# Patient Record
Sex: Male | Born: 1935 | Race: White | Hispanic: No | Smoking: Former smoker
Health system: Southern US, Community
[De-identification: ages and names within clinical notes are randomized; demographics above are authoritative.]

## PROBLEM LIST (undated history)

## (undated) DIAGNOSIS — I10 Essential (primary) hypertension: Secondary | ICD-10-CM

## (undated) DIAGNOSIS — Z9049 Acquired absence of other specified parts of digestive tract: Secondary | ICD-10-CM

## (undated) DIAGNOSIS — I2510 Atherosclerotic heart disease of native coronary artery without angina pectoris: Secondary | ICD-10-CM

## (undated) DIAGNOSIS — Z720 Tobacco use: Secondary | ICD-10-CM

## (undated) DIAGNOSIS — N289 Disorder of kidney and ureter, unspecified: Secondary | ICD-10-CM

## (undated) DIAGNOSIS — J449 Chronic obstructive pulmonary disease, unspecified: Secondary | ICD-10-CM

## (undated) HISTORY — DX: Disorder of kidney and ureter, unspecified: N28.9

## (undated) HISTORY — PX: TRANSURETHRAL RESECTION OF PROSTATE: SHX73

## (undated) HISTORY — DX: Atherosclerotic heart disease of native coronary artery without angina pectoris: I25.10

## (undated) HISTORY — PX: CHOLECYSTECTOMY: SHX55

## (undated) HISTORY — DX: Acquired absence of other specified parts of digestive tract: Z90.49

## (undated) HISTORY — DX: Chronic obstructive pulmonary disease, unspecified: J44.9

## (undated) HISTORY — DX: Essential (primary) hypertension: I10

## (undated) HISTORY — DX: Tobacco use: Z72.0

---

## 2001-03-09 ENCOUNTER — Inpatient Hospital Stay (HOSPITAL_COMMUNITY): Admission: AD | Admit: 2001-03-09 | Discharge: 2001-03-16 | Payer: Self-pay | Admitting: *Deleted

## 2001-03-11 ENCOUNTER — Encounter: Payer: Self-pay | Admitting: *Deleted

## 2001-03-12 ENCOUNTER — Encounter: Payer: Self-pay | Admitting: *Deleted

## 2005-10-05 HISTORY — PX: CORONARY ARTERY BYPASS GRAFT: SHX141

## 2005-10-19 ENCOUNTER — Inpatient Hospital Stay (HOSPITAL_COMMUNITY): Admission: EM | Admit: 2005-10-19 | Discharge: 2005-11-13 | Payer: Self-pay | Admitting: Emergency Medicine

## 2005-10-20 ENCOUNTER — Ambulatory Visit: Payer: Self-pay | Admitting: Cardiology

## 2005-10-20 ENCOUNTER — Ambulatory Visit: Payer: Self-pay | Admitting: *Deleted

## 2005-10-23 ENCOUNTER — Ambulatory Visit: Payer: Self-pay | Admitting: Pulmonary Disease

## 2005-11-01 ENCOUNTER — Encounter: Payer: Self-pay | Admitting: Thoracic Surgery (Cardiothoracic Vascular Surgery)

## 2005-11-23 ENCOUNTER — Ambulatory Visit: Payer: Self-pay | Admitting: Cardiology

## 2005-12-01 ENCOUNTER — Ambulatory Visit: Payer: Self-pay | Admitting: Cardiology

## 2005-12-31 ENCOUNTER — Ambulatory Visit: Payer: Self-pay | Admitting: Cardiology

## 2006-06-20 IMAGING — CR DG CHEST 1V PORT
1 series · 1 of 1 positions shown · non-contrast
Comparison: none

CLINICAL DATA: Myocardial infarction.   Question CHF. 
 PORTABLE CHEST ? 10/28/05:

[view not recorded]
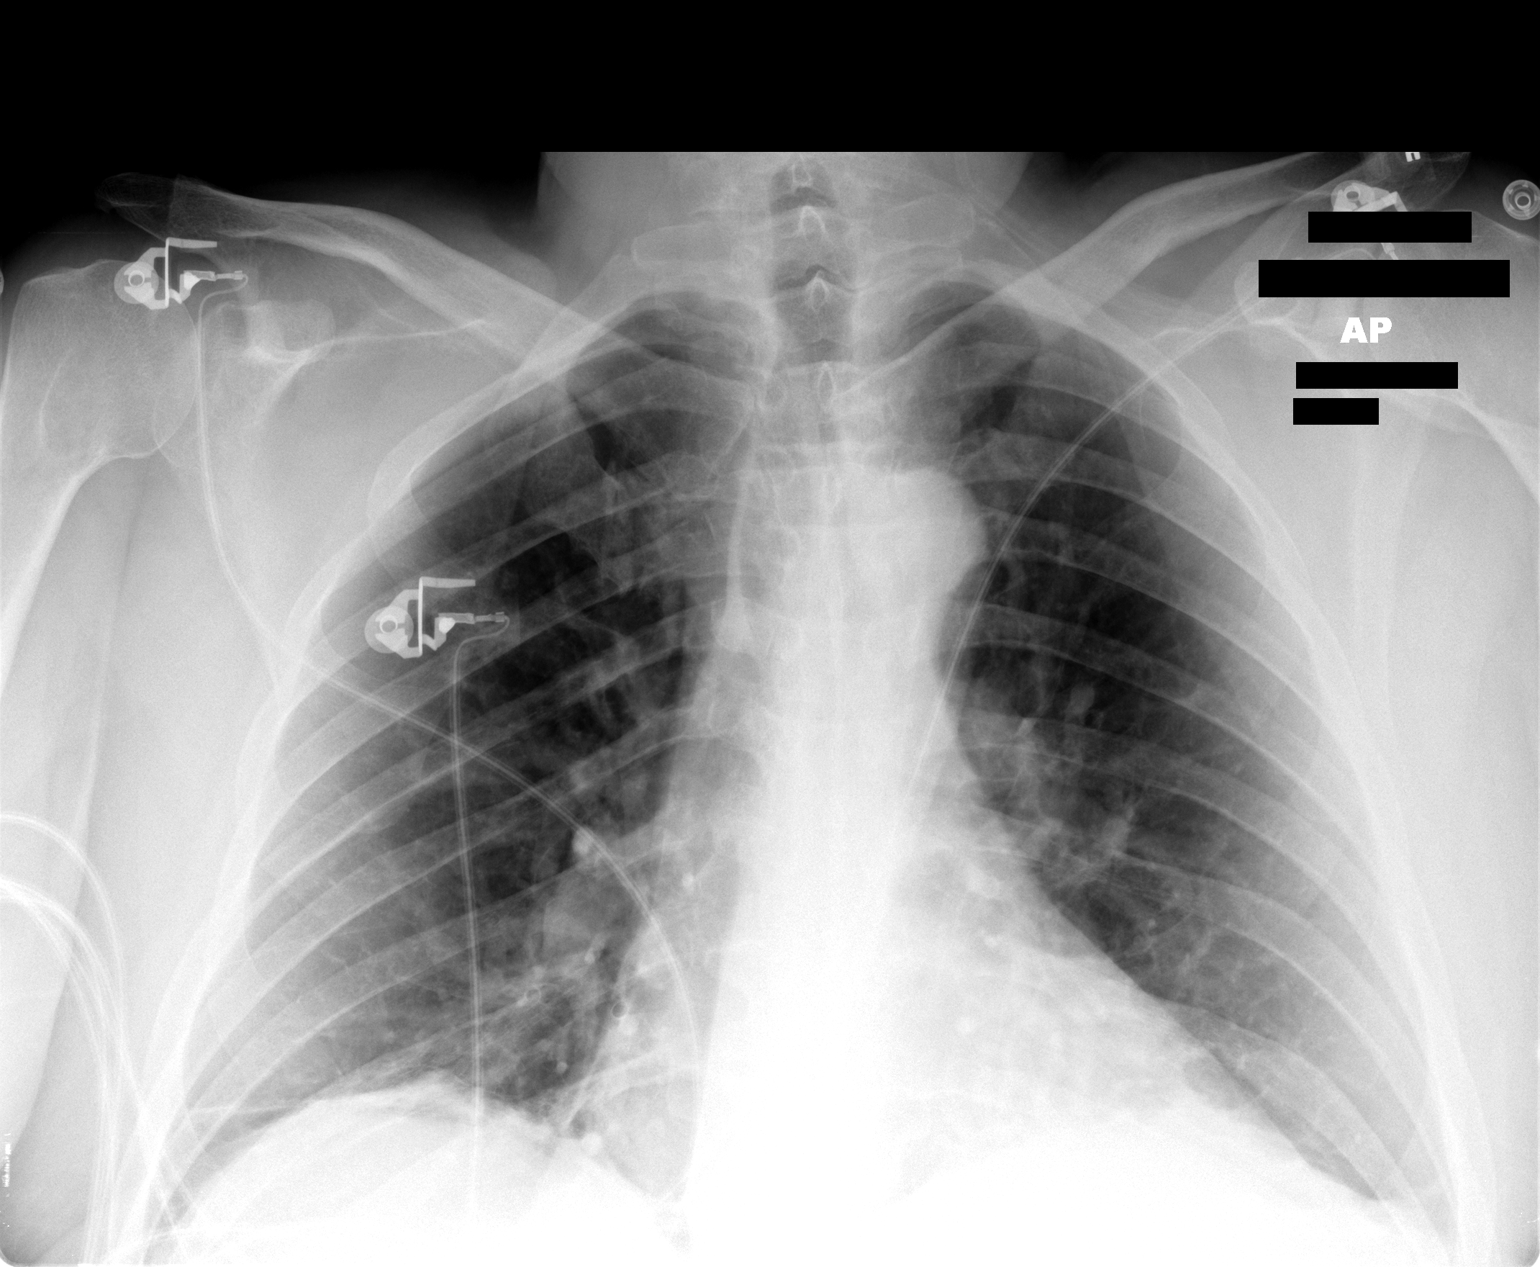

[1 of 1 positions shown; findings below may reference images not displayed]

FINDINGS: There is cardiomegaly.  Streaky bibasilar opacities most compatible with atelectasis persist.  No edema.  No effusion.
IMPRESSION: 1.  Negative for pulmonary edema. 
 2.  Persistent bibasilar streaky opacities most consistent with atelectasis.

## 2006-06-25 IMAGING — CR DG CHEST 1V PORT
1 series · 1 of 1 positions shown · non-contrast
Comparison: 11/01/05.

CLINICAL DATA: M.I.
 PORTABLE CHEST ([DATE] HOURS):

[view not recorded]
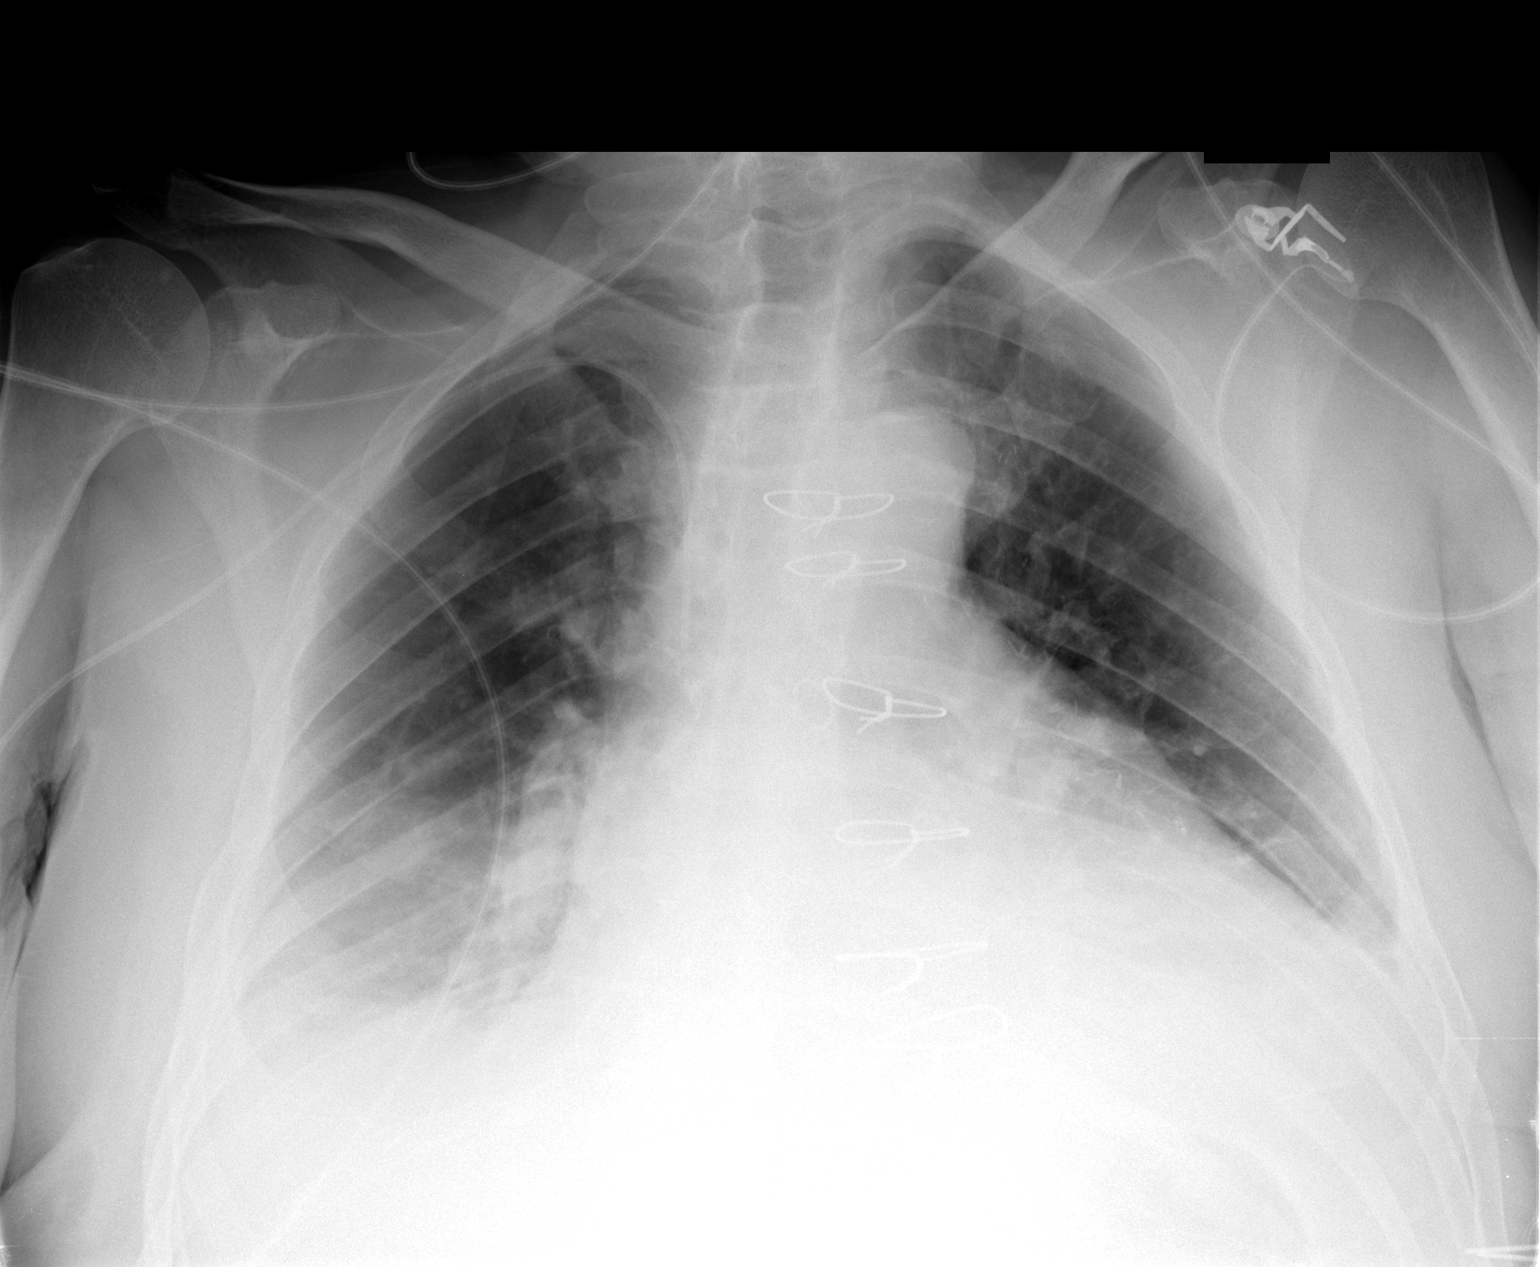

[1 of 1 positions shown; findings below may reference images not displayed]

FINDINGS: The right upper extremity PICC has been retracted.  The tip is now in the SVC.  The heart is enlarged.  Vascular congestion and interstitial edema are stable.  Bibasilar opacity and bilateral effusions are stable.  No pneumothoraces are seen.
IMPRESSION: Tip of the right upper extremity PICC is now in the SVC.  Otherwise stable exam.

## 2006-06-27 IMAGING — CR DG CHEST 1V PORT
1 series · 1 of 1 positions shown · non-contrast
Comparison: 11/03/05
 Cardiomegaly.

CLINICAL DATA: Chest pain.  CABG.
 PORTABLE CHEST- 1 VIEW (7177 hours):

[view not recorded]
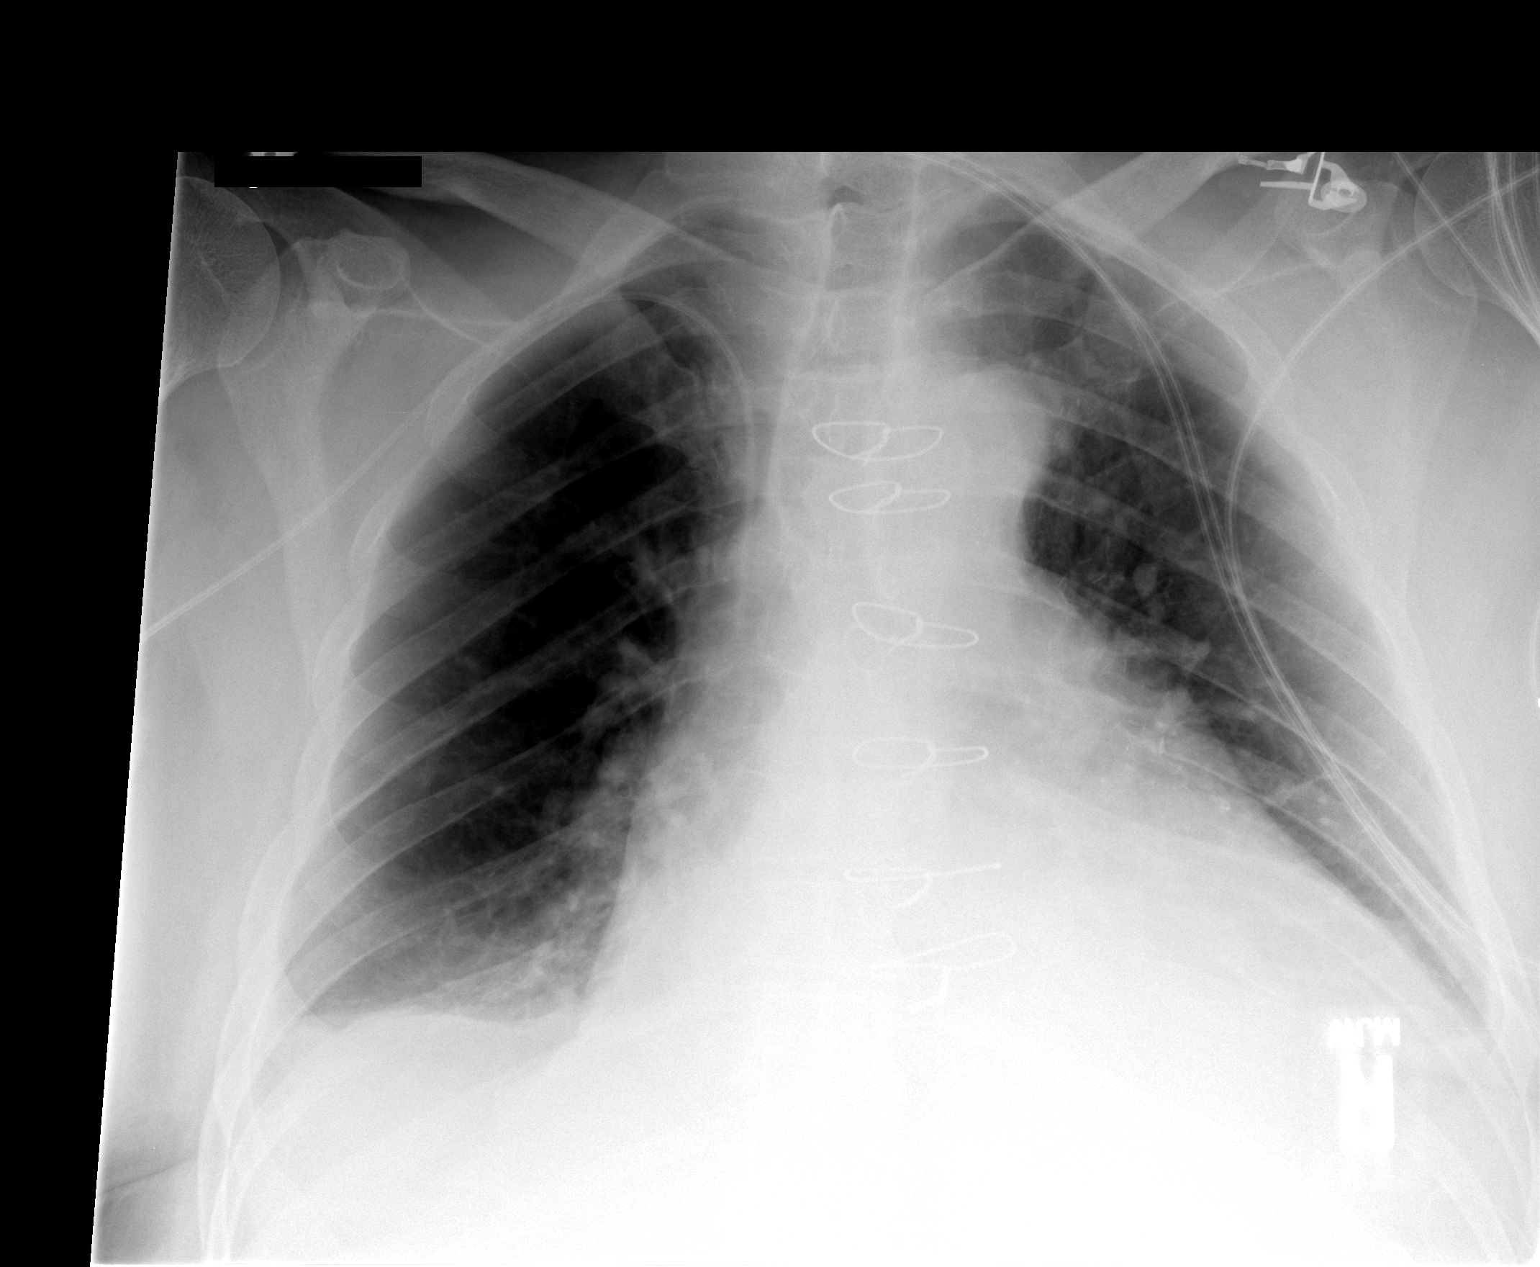

[1 of 1 positions shown; findings below may reference images not displayed]

Improving mild vascular congestion.  Improving left lower lobe atelectasis.  Small bilateral effusions persist.  PICC line tip SVC.  No pneumothorax.
IMPRESSION: Improved atelectasis and vascular congestion.

## 2006-07-03 IMAGING — CR DG CHEST 2V
2 series · 2 of 2 positions shown · non-contrast
Comparison: 11/08/05.

CLINICAL DATA: Post CABG.  Shortness of breath.  MI.
 CHEST - 2 VIEW:

[w chest pa]
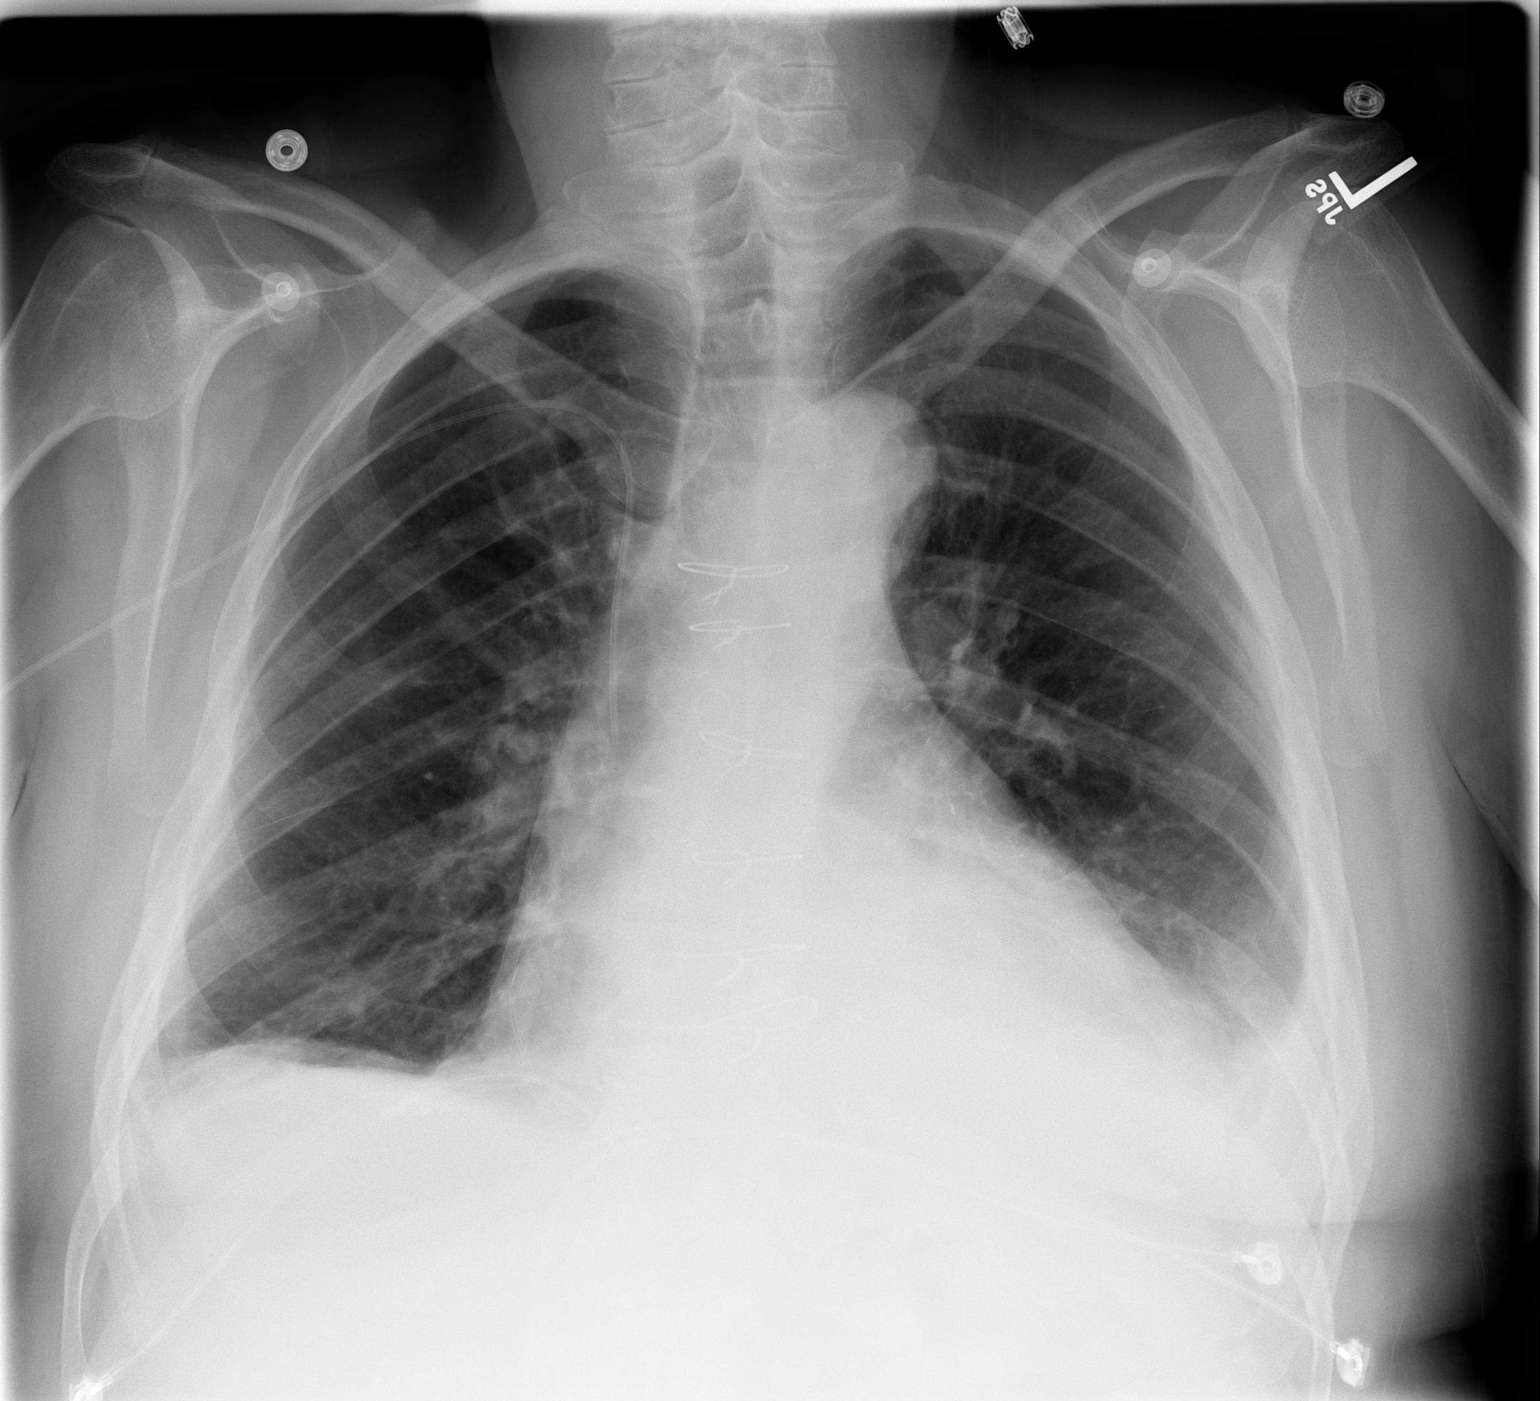

[w chest lat]
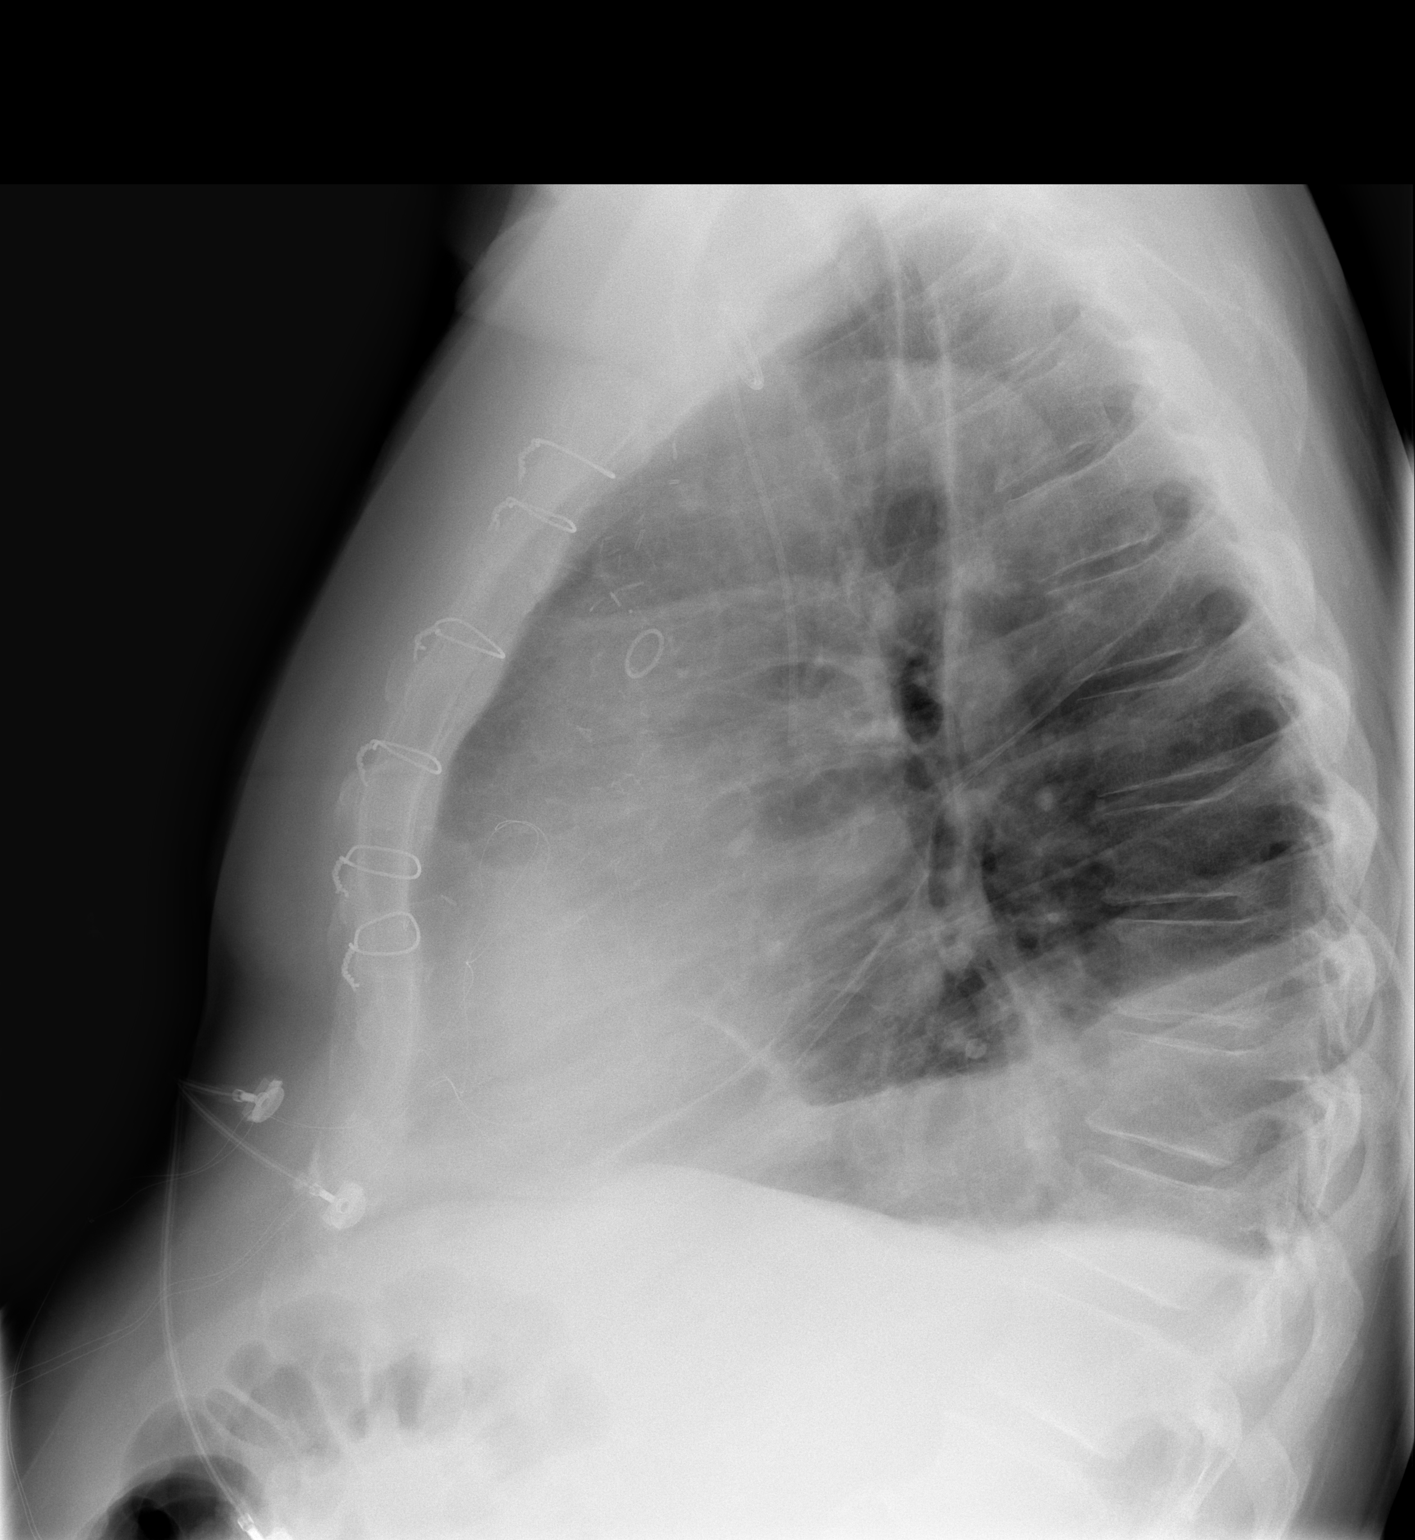

[2 of 2 positions shown; findings below may reference images not displayed]

FINDINGS: Enlarged cardiopericardial silhouette.  Small left pleural effusion.  Mild atelectasis at the left base.  Mild pulmonary vascular congestion.
IMPRESSION: Enlarged cardiopericardial silhouette.  Mild vascular congestion.  Small left pleural effusion and mild atelectasis at the bases.

## 2006-07-30 ENCOUNTER — Ambulatory Visit: Payer: Self-pay | Admitting: Cardiology

## 2006-08-13 ENCOUNTER — Ambulatory Visit: Payer: Self-pay | Admitting: Cardiology

## 2006-10-27 ENCOUNTER — Encounter: Payer: Self-pay | Admitting: Cardiology

## 2007-04-20 ENCOUNTER — Ambulatory Visit: Payer: Self-pay | Admitting: Physician Assistant

## 2008-05-02 ENCOUNTER — Ambulatory Visit: Admission: RE | Admit: 2008-05-02 | Discharge: 2008-07-31 | Payer: Self-pay | Admitting: Radiation Oncology

## 2008-05-21 ENCOUNTER — Ambulatory Visit: Payer: Self-pay | Admitting: Cardiology

## 2008-05-21 ENCOUNTER — Encounter: Payer: Self-pay | Admitting: Physician Assistant

## 2008-08-06 ENCOUNTER — Encounter: Payer: Self-pay | Admitting: Physician Assistant

## 2009-06-14 DIAGNOSIS — I2510 Atherosclerotic heart disease of native coronary artery without angina pectoris: Secondary | ICD-10-CM | POA: Insufficient documentation

## 2009-06-14 DIAGNOSIS — J45909 Unspecified asthma, uncomplicated: Secondary | ICD-10-CM | POA: Insufficient documentation

## 2009-06-14 DIAGNOSIS — N259 Disorder resulting from impaired renal tubular function, unspecified: Secondary | ICD-10-CM | POA: Insufficient documentation

## 2009-06-14 DIAGNOSIS — E119 Type 2 diabetes mellitus without complications: Secondary | ICD-10-CM | POA: Insufficient documentation

## 2009-06-17 ENCOUNTER — Ambulatory Visit: Payer: Self-pay | Admitting: Cardiology

## 2009-06-17 DIAGNOSIS — J4489 Other specified chronic obstructive pulmonary disease: Secondary | ICD-10-CM | POA: Insufficient documentation

## 2009-06-17 DIAGNOSIS — I4510 Unspecified right bundle-branch block: Secondary | ICD-10-CM | POA: Insufficient documentation

## 2009-06-17 DIAGNOSIS — J449 Chronic obstructive pulmonary disease, unspecified: Secondary | ICD-10-CM | POA: Insufficient documentation

## 2009-06-17 DIAGNOSIS — Z951 Presence of aortocoronary bypass graft: Secondary | ICD-10-CM | POA: Insufficient documentation

## 2009-08-19 ENCOUNTER — Encounter: Payer: Self-pay | Admitting: Cardiology

## 2009-08-20 ENCOUNTER — Encounter: Payer: Self-pay | Admitting: Cardiology

## 2010-01-31 ENCOUNTER — Ambulatory Visit: Payer: Self-pay | Admitting: Cardiology

## 2010-11-03 ENCOUNTER — Telehealth (INDEPENDENT_AMBULATORY_CARE_PROVIDER_SITE_OTHER): Payer: Self-pay | Admitting: *Deleted

## 2010-11-04 NOTE — Assessment & Plan Note (Signed)
Summary: 6 MO FU MARCH REMINDER-SRS   Visit Type:  Follow-up Primary Provider:  Dr. Erasmo Downer   History of Present Illness: the patient is a 77 year old male with history of coronary artery disease, status post coronary artery bypass grafting in 2007 normal LV systolic function.  The patient denies any chest pain or shortness of breath palpitations or syncope.  He remains very active and still walks every day.  Is stable from a cardiovascular perspective.  He reports no other symptoms.  Preventive Screening-Counseling & Management  Alcohol-Tobacco     Smoking Status: quit > 6 months  Comments: quit smoking about 25 yrs ago. smoked for about 30 yrs  Current Medications (verified): 1)  Potassium Chloride Crys Cr 20 Meq Cr-Tabs (Potassium Chloride Crys Cr) .... Take 1 Tablet By Mouth Once A Day 2)  Pravastatin Sodium 40 Mg Tabs (Pravastatin Sodium) .... Take 1 Tablet By Mouth At Bedtime 3)  Metoprolol Tartrate 50 Mg Tabs (Metoprolol Tartrate) .... Take 1/2 Tablet By Mouth Twice A Day 4)  Furosemide 40 Mg Tabs (Furosemide) .... Two Times A Day 5)  Aspirin 81 Mg  Tabs (Aspirin) .... Daily 6)  Prilosec 20 Mg Cpdr (Omeprazole) .... Daily 7)  Mucinex 600 Mg Xr12h-Tab (Guaifenesin) .... As Needed 8)  Multivitamins   Tabs (Multiple Vitamin) .... Daily 9)  Hormone Injection .... Every 3 Months 10)  Proventil Hfa 108 (90 Base) Mcg/act Aers (Albuterol Sulfate) .... 2 Puffs As Needed 11)  Alendronate Sodium 70 Mg Tabs (Alendronate Sodium) .... Take 1 Tablet By Mouth Once Per Week.  Allergies: No Known Drug Allergies  Comments:  Nurse/Medical Assistant: The patient's medications were reviewed with the patient and were updated in the Medication List. Pt brought a list of medications to office visit.  Cyril Loosen, RN, BSN (January 31, 2010 9:22 AM)  Past History:  Past Medical History: Last updated: 06/17/2009  1.  Coronary artery disease as outlined above. non-ST elevation  myocardial infarction followed by coronary bypass grafting in 2007 with normal LV systolic function.  2.  Hypertension.  3.  Diet managed type 2 diabetes mellitus.  4.  Chronic renal insufficiency.  5.  Reported history of asthma with remote tobacco use.  6.  History of hematuria with TURP back in 2002.  7.  Status post cholecystectomy. COPD  Past Surgical History: Last updated: 06/14/2009  Status post cholecystectomy.  Social History: Last updated: 06/14/2009 The patient lives in [place] with his wife. He has a 30  pack year history of smoking but quit 20 years ago. He is retired from [employer]. He denies any significant alcohol or illicit substance use.  Risk Factors: Smoking Status: quit > 6 months (01/31/2010)  Review of Systems  The patient denies fatigue, malaise, fever, weight gain/loss, vision loss, decreased hearing, hoarseness, chest pain, palpitations, shortness of breath, prolonged cough, wheezing, sleep apnea, coughing up blood, abdominal pain, blood in stool, nausea, vomiting, diarrhea, heartburn, incontinence, blood in urine, muscle weakness, joint pain, leg swelling, rash, skin lesions, headache, fainting, dizziness, depression, anxiety, enlarged lymph nodes, easy bruising or bleeding, and environmental allergies.    Vital Signs:  Patient profile:   77 year old male Height:      70 inches Weight:      188.25 pounds Pulse rate:   59 / minute BP sitting:   137 / 77  (left arm) Cuff size:   regular  Vitals Entered By: Cyril Loosen, RN, BSN (January 31, 2010 9:20 AM) Comments Follow  up visit   Physical Exam  Additional Exam:  General: Well-developed, well-nourished in no distress head: Normocephalic and atraumatic eyes PERRLA/EOMI intact, conjunctiva and lids normal nose: No deformity or lesions mouth normal dentition, normal posterior pharynx neck: Supple, no JVD.  No masses, thyromegaly or abnormal cervical nodes lungs: Normal breath sounds  bilaterally without wheezing.  Normal percussion heart: regular rate and rhythm with normal S1 and S2, no S3 or S4.  PMI is normal.  No pathological murmurs abdomen: Normal bowel sounds, abdomen is soft and nontender without masses, organomegaly or hernias noted.  No hepatosplenomegaly musculoskeletal: Back normal, normal gait muscle strength and tone normal pulsus: Pulse is normal in all 4 extremities Extremities: No peripheral pitting edema neurologic: Alert and oriented x 3 skin: Intact without lesions or rashes cervical nodes: No significant adenopathy psychologic: Normal affect    Impression & Recommendations:  Problem # 1:  CORONARY ARTERY BYPASS GRAFT, HX OF (ICD-V45.81) no recurrent chest pain.  No indication for stress testing.  Continue current medical regimen.  Problem # 2:  RBBB (ICD-426.4) stable His updated medication list for this problem includes:    Metoprolol Tartrate 50 Mg Tabs (Metoprolol tartrate) .Marland Kitchen... Take 1/2 tablet by mouth twice a day    Aspirin 81 Mg Tabs (Aspirin) .Marland Kitchen... Daily  Problem # 3:  RENAL INSUFFICIENCY (ICD-588.9) although the patient's primary care physician.  Patient Instructions: 1)  Your physician recommends that you continue on your current medications as directed. Please refer to the Current Medication list given to you today. 2)  Follow up in  1 year

## 2010-11-12 NOTE — Progress Notes (Signed)
Summary: NEED CHOLESTEROL LABS/  Phone Note Outgoing Call Call back at Home Phone [phone]   Call placed by: Carlye Grippe,  November 03, 2010 9:17 AM Call placed to: Patient Summary of Call: Called and informed patient that we don't have any cholesterol labs since 2009 and he said Anwar checked lipids about 6 months ago. Nurse advised patient to have labs sent to our office so that we can manage refills according to results or to have request for lipid meds sent to PCP. Nurse also notified [facility] office to send [place] results or manage refills for pravastatin for this patient. Initial call taken by: Carlye Grippe,  November 03, 2010 9:18 AM

## 2011-02-09 ENCOUNTER — Other Ambulatory Visit: Payer: Self-pay | Admitting: Cardiology

## 2011-02-11 ENCOUNTER — Other Ambulatory Visit: Payer: Self-pay | Admitting: *Deleted

## 2011-02-11 MED ORDER — POTASSIUM CHLORIDE CRYS ER 20 MEQ PO TBCR: 20.0000 meq | EXTENDED_RELEASE_TABLET | Freq: Every day | ORAL | Status: DC

## 2011-02-17 NOTE — Assessment & Plan Note (Signed)
[facility]                          [facility] NOTE   NAME:Akopyan, Winford J                       MRN:          [id number]  DATE:05/21/2008                            DOB:          [birth year]    CARDIOLOGIST:  Learta Codding, MD, [facility]   PRIMARY CARE PHYSICIAN:  Erasmo Downer, MD   REASON FOR VISIT:  Twelve-month followup.   HISTORY OF PRESENT ILLNESS:  [name] is a 77 year old male patient  with a history of CAD status post CABG in January 2007, who returns to  the office today for followup.  Since last being seen, he has been  diagnosed with prostate cancer.  He is apparently on Lupron therapy and  the plan is to proceed with radiation therapy in the coming month.  Aside from his prostate cancer diagnosis, he has been doing well.  He  denies any chest pain.  He walks up to 2 hours a day without any  symptoms of chest discomfort or shortness of breath.  He denies  orthopnea, PND, or pedal edema.  He denies any syncope or near-syncope.  He does note some increased congestion recently as well as wheezing.  He  has been taking Mucinex over-the-counter for this.   CURRENT MEDICATIONS:  Lopressor 25 mg b.i.d., Lasix 40 mg b.i.d.,  Potassium 20 mEq daily, Aspirin 81 mg daily, Prilosec daily,Multivitamin  daily, Hormone shots as directed,Mucinex p.r.n.   PHYSICAL EXAMINATION:  GENERAL:  He is a well-nourished, well-developed  male, in acute distress.  VITAL SIGNS:  Blood pressure is 129/78, pulse 67, and weight 174.8  pounds.  HEENT:  Normal.  NECK:  Without JVD.  CARDIAC:  Normal S1 and S2, regular rate and rhythm.  No murmur.  LUNGS:  Clear to auscultation bilaterally.  ABDOMEN:  Soft, nontender, normoactive bowel sounds.  No organomegaly.  EXTREMITIES:  Without edema.  NEUROLOGIC:  He is alert and oriented x3.  Cranial nerves II-XII grossly  intact.  VASCULAR:  No carotid artery bruits noted bilaterally.   Electrocardiogram  reveals sinus rhythm with a heart rate of 67,  rightward axis, right bundle-branch block.  No significant change to the  previous tracing.   ASSESSMENT/PLAN:  1. Coronary artery disease status post non-ST-elevation myocardial      infarction followed by subsequent coronary artery bypass graft,      January 2007.  The patient has been noted to have preserved LV      function in the past.  He is overall stable and does not have any      symptoms of angina.  He is on a good medical regimen aside from      statin therapy.  No changes will be made today in his current      medications.  2. Dyslipidemia.  The patient apparently came off the statin last year      when he was told that his cholesterol numbers looked good.  We will      restart him with pravastatin 40 mg nightly and recheck lipids and      LFTs in  12 weeks.  3. Hypertension, controlled.  He will continue on his current      medications.  4. Chronic renal insufficiency.  He will continue to follow up with      Dr. Linna Darner.  5. Chronic obstructive pulmonary disease.  He has had increased      congestion recently.  He will continue on Mucinex and follow up      with Dr. Linna Darner for this as well.   DISPOSITION:  The patient will be brought back in followup in the next  12 months or sooner p.r.n.      Tereso Newcomer, PA-C  Electronically Signed      Madolyn Frieze. Jens Som, MD, [facility]  Electronically Signed   SW/MedQ  DD: 05/21/2008  DT: 05/22/2008  Job #: [id number]   cc:   Erasmo Downer, MD

## 2011-02-17 NOTE — Assessment & Plan Note (Signed)
[facility]                          [facility] NOTE   NAME:Babich, John J                       MRN:          [id number]  DATE:04/20/2007                            DOB:          Apr 24, 1930    CARDIOLOGIST:  Dr. Lewayne Bunting.   PRIMARY CARE PHYSICIAN:  Dr. Linna Darner.   REASON FOR VISIT:  Six month followup.   HISTORY OF PRESENT ILLNESS:  Mr. Renovato is a very pleasant 77 year old  male patient with a history of coronary artery disease status post non-  ST elevation myocardial infarction and subsequent 2-vessel CABG who  reports to the office today for routine followup.  He tells me that he  feels better than he has felt in many years.  Overall since his bypass  surgery he has lost about 50 pounds.  He is walking every day.  He says  he is walking up to 10 miles.  He denies any chest discomfort or  shortness of breath.  Denies any syncope or near syncope.  Denies any  orthopnea or paroxysmal nocturnal dyspnea or pedal edema.  Denies any  palpitations.   CURRENT MEDICATIONS:  1. Lopressor 25 mg b.i.d.  2. Vytorin 10/20 mg nightly.  3. Lasix 40 mg b.i.d.  4. Klor-Con 20 mEq daily.  5. Aspirin 81 mg daily.  6. Prilosec over the counter.  7. Multivitamin daily.  8. Albuterol p.r.n.   ALLERGIES:  NO KNOWN DRUG ALLERGIES.   PHYSICAL EXAMINATION:  He is a well-nourished, well-developed male in no  distress.  Blood pressure is 118/78. Pulse 50, weight 178.8 pounds.  HEENT:  Normal.  NECK:  Without JVD, carotids without bruits bilaterally.  CARDIAC:  Normal S1-S2, regular rate and rhythm.  LUNGS:  Clear to auscultation bilaterally without wheezing, rhonchi, or  rales.  ABDOMEN:  Soft, nontender.  Easily reducible periumbilical hernia noted.  EXTREMITIES:  Without edema.  Calves soft, nontender.  NEUROLOGIC:  He is alert and oriented x3, cranial nerves II-XII grossly  intact, dorsalis pedis and posterior tibialis pulses are 1-2+  bilaterally.   Electrocardiogram reveals sinus rhythm with a heart rate of 52,  rightward axis, right bundle branch block, no significant change since  previous tracing.   IMPRESSION:  1. Coronary artery disease.      a.     Status post non-ST elevation myocardial infarction January       2007 followed by 2-vessel coronary artery bypass grafting (left       internal mammary artery to the diagonal, vein graft to the obtuse       marginal).  2. Preserved left ventricular function.  3. Chronic obstructive pulmonary disease.  4. Hypertension.  5. Hyperlipidemia.  6. Chronic renal insufficiency.   PLAN:  Patient is doing well from a cardiovascular standpoint.  He is  having no symptoms of angina.  He is quite active and walking on a daily  basis.  He has lost a considerable amount of weight since 2007 and he is  also no longer smoking.  We will set him up for followup CMET and lipids  to  both monitor his lipid management and his renal insufficiency.  Otherwise he will continue the same medications and followup with [place] in  about 6 months or sooner as needed.      Tereso Newcomer, PA-C  Electronically Signed      Learta Codding, MD,FACC  Electronically Signed   SW/MedQ  DD: 04/20/2007  DT: 04/20/2007  Job #: [id number]   cc:   Erasmo Downer, MD

## 2011-02-20 NOTE — Discharge Summary (Signed)
NAMELehi [name], Jimmy [name]                ACCOUNT NO.:  [id number]   MEDICAL RECORD NO.:  [id number]          PATIENT TYPE:  INP   LOCATION:  2036                         FACILITY:  [facility]   PHYSICIAN:  Jimmy [name], M.D.DATE OF BIRTH:  1930-05-26   DATE OF ADMISSION:  10/20/2005  DATE OF DISCHARGE:  11/12/2005                                 DISCHARGE SUMMARY   PRIMARY DIAGNOSES:  Severe three-vessel coronary artery disease, status post  non-Q wave myocardial infarction.   IN HOSPITAL DIAGNOSES:  1.  Acute blood loss anemia.  2.  Acute renal insufficiency.  3.  Nonsustained ventricular tachycardia.  4.  Postoperative atrial fibrillation.  5.  Superficial sternal wound infection.   SECONDARY DIAGNOSES:  1.  Hypertension.  2.  Diabetes mellitus type 2.  3.  Chronic renal insufficiency.  4.  History of asthma with remote tobacco use.  5.  History of hematuria with transurethral resection of prostate back in      2002.  6.  Status post cholecystectomy.  7.  Chronic obstructive pulmonary disease.  8.  History of coronary artery disease.   ALLERGIES:  NO KNOWN DRUG ALLERGIES.   IN HOSPITAL OPERATIONS AND PROCEDURES:  1.  Cardiac catheterization with left heart catheterization and selective      coronary artery angiography.  2.  Coronary artery bypass grafting x2 using a left internal mammary artery      to the diagonal and a saphenous vein graft to obtuse marginal.      Endoscopic vein harvesting from the left leg was done.   HISTORY AND PHYSICAL, AND HOSPITAL COURSE:  [name] is a 77 year old  gentleman with multiple medical problems.  He had presented with acute  respiratory problems, the patient has a history of COPD and asthma.  On  presentation with acute respiratory problems, the patient was seen to have  ruled in for a non-ST elevated myocardial infarction.  The patient was  transferred [facility].  The patient was taken  to the cath  lab by Dr. Dorethea Clan on October 21, 2005 where he underwent cardiac  catheterization.  This showed severe three-vessel coronary artery disease.  The patient was seen and evaluated by Dr. Dorris [name].  Dr. Dorris [name]  discussed with the patient undergoing coronary artery bypass grafting.  He  discussed the risks and benefits of this procedure.  The patient  acknowledged his understanding and agreed to proceed.  The patient was  tentatively scheduled for surgery for Monday, October 26, 2005.  Surgery had  to be postponed due to requiring a pulmonary workup.  Pulmonary was  consulted and evaluated the patient.  They recommended placing the patient  on a proton pump inhibitor twice a day.  They also recommended obtaining  PFTs prior to surgery.  They did not feel that there was a need for  antibiotics at that time.  They started the patient on a bronchodilator  regimen.  PFTs were done on Monday, October 26, 2005, which showed mild  obstruction by the FEV1.  Also, showed moderate air trapping.  Also  prior to  surgery, the patient subsequently had a retroperitoneal bleed while on  anticoagulation.  After a discontinuation of anticoagulation, the patient  continued to have unstable post infarct anginal symptoms.  Dr. Dorris [name]  discussed with patient the high risk of undergoing surgery due to pulmonary  issues, as well as patient having developed acute on chronic renal  insufficiency.  It was felt that the patient would not be able to postpone  the surgery any further due to anginal symptoms.  The surgery was scheduled  for October 30, 2005.  Prior to surgery, the patient had preoperative  bilateral carotid duplex ultrasound done showing no significant ICA  stenosis.   The patient was taken to the operating room on October 30, 2005 where he  underwent coronary artery bypass grafting x2 using a left internal mammary  artery to the diagonal and saphenous vein graft to the obtuse marginal.   Endoscopic vein harvesting was done from the left leg.  The patient  tolerated this procedure well and was transferred up to the intensive care  unit in stable condition.  The patient was extubated late evening/early  morning following surgery.  Following extubation, the patient seemed to be  alert and oriented x4, and neuro intact.  During the patient's postoperative  course, the patient's creatinine continued to elevate.  He was started on  renal dopamine postoperatively.  Over the next several days, the patient's  creatinine plateaued at 3.  It then began to start trending back down to his  baseline.  Last creatinine documented was 2.5 on postoperative day 14.  Also  during the patient's postoperative course, he developed postoperative atrial  fibrillation.  This was postop day 2.  The patient was started on amiodarone  and was able to convert back to normal sinus rhythm.  The patient remained  in normal sinus rhythm the remainder of his hospital stay.  The IV  amiodarone was discontinued and the patient was started on p.o. amiodarone.  The patient will be DC'd home on amiodarone.  The patient also developed  acute blood loss anemia postoperatively.  This did not require transfusion.  This was monitored and the patient remained stable with last hemoglobin and  hematocrit being 9 and 26.  The patient was asymptomatic from the anemia.  Also during the patient's postoperative course, he developed a sternal  drainage from his distal incision site.  The patient was started on  vancomycin.  Cultures were sent.  These cultures showed to be negative.  IV  vancomycin was continued during his hospital stay.  The patient was started  on ciprofloxacin on postoperative day 13.  Plan is to DC home on Cipro for  several days.  The remainder of the patient's postoperative course was  unremarkable.  Chest tubes and lines were DC'd in the normal fashion.  The patient was transferred out to 2000 on postop  day 10.  The patient was able  to weaned off oxygen satting greater than 90% on room air.  The patient's  CBGs were monitored during his postoperative course and were well  controlled.  He did require Lantus insulin for several days, but this was  DC'd prior to discharge home.  The patient remained afebrile during his  hospital course.  The patient's blood pressure was stable during his  hospital course and medications were adjusted appropriately.  Pulmonary did  reevaluate the patient postoperatively and recommended continuing the  Spiriva daily, and having the albuterol MDI as needed q.4h.  They also  recommended keeping patient on Protonix b.i.d. and to avoid ACE inhibitors.  They thought patient had some moderate emphysema.  From their standpoint,  the patient is ready for DC home.  The patient did develop volume overload  postoperatively and was started on diuretics for several days.  He was back  down towards his normal weight prior to DC home.  Do not plan on DC'ing  patient on Lasix due to the acute on chronic renal insufficiency and the  patient being back near his preoperative weight.   The patient is tentatively ready for DC home, November 13, 2005.  Clydie Braun  Meninger has arranged for a home health nurse to assist with the patient at  home.  Mr. Krahenbuhl received instructions on diet, activity level, and  incisional care.  He was told no driving until released to do so and no  heavy lifting over 10 pounds.  The patient was told he was allowed to shower  washing his incisions using soap and water.  He is to contact the office if  his incisions' drainage does not decrease or becomes purulent, or incision  starts to open up.  The patient is also to contact the office if he develops  a fever greater than 101.1 degrees Fahrenheit.  The patient was told to  ambulate 3 to 4 per day and progress as tolerated and to continue his  breathing exercise.  He was educated on diet to be low-fat,  low-salt.  A  follow up appointment has been arranged with Dr. Dorris [name] for December 04, 2005 at 11:45 a.m.  An appointment will be arranged with Dr. Diona Browner for in  2 weeks.  The patient will obtain AP and lateral chest x-ray at this  appointment, which he will then bring with him to Dr. Sunday Corn  appointment.   DISCHARGE MEDICATIONS:  1.  Aspirin 325 mg daily.  2.  Lopressor 25 mg b.i.d.  3.  Vytorin 10/20 q.h.s.  4.  Cipro 500 mg daily x7 days.  5.  Protonix 40 mg b.i.d.  6.  Spiriva inhaler 18 mcg, 1 puff in the a.m.  7.  Albuterol MDI 2 puffs q.4h. p.r.n.  8.  Amiodarone 200 mg b.i.d.  9.  Darvocet N-100 1 to 2 tabs q.4-6h. p.r.n. pain.      Theda Belfast, PA    ______________________________  Jimmy Decent Dorris [name], M.D.    KMD/MEDQ  D:  11/12/2005  T:  11/12/2005  Job:  [id number]   cc:   Jonelle Sidle, M.D. [facility]  [address]  [place]  [place] [address]

## 2011-02-20 NOTE — Discharge Summary (Signed)
[place]. [facility]  Patient:    Jimmy [name], Jimmy [name]                      MRN: [id number] Adm. Date:  [id number] Disc. Date: [id number] Attending:  Daisey Must Dictator:   Brita Romp, P.A.C. CC:         M. Linna Darner, M.D., [address], [place], [place] [address]  Dr. Dennie Maizes, [address] [place], [place] [address]  [facility]   Discharge Summary  DISCHARGE DIAGNOSES: 1. Non-Q-wave myocardial infarction. 2. Status post transurethral resection of the prostate. 3. Chronic renal insufficiency. 4. Anemia. 5. Non-insulin dependent diabetes.  HOSPITAL COURSE:  The patient was received in transfer from [facility] on March 09, 2001, for cardiac catheterization.  The patient had undergone a TURP procedure at [facility].  Postoperatively, he had complained of some chest pain and had non-Q-wave MI.  On March 10, 2001, the patient was seen by Lewayne Bunting, M.D.  He noted that there was decreased bleeding from the Foley catheter.  The patient denied any recurrent chest pain.  His BUN is noted to be 23 with a creatinine of 2.1. Hemoglobin is 9.7 and hematocrit 28.1.  Dr. Andee Lineman felt that it was best to discontinue the patients heparin and defer catheterization for the current time.  He discussed this with the family and planned for adenosine Cardiolite exam.  Later that night, the patients hemoglobin had dropped to 7.4 with hematocrit of 21.7.  He was given two units of packed red cells.  The following morning, the patient was seen by Daisey Must, M.D.  It was noted that his hemoglobin was 8.8 with hematocrit of 25.3.  His BUN had risen to 46 with creatinine of 3.1.  As a result of the patients continuing anemia, additional transfusion of one unit of packed cells was ordered.  The patient was then seen by Lindaann Slough, M.D., of urology.  He had reviewed the prior renal ultrasound and ordered a repeat one to assess postop changes. The  results noted some mild fullness in the right collecting system and some moderate left hydronephrosis.  Dr. Brunilda Payor noted that he had had bilateral hydronephrosis preop.  He ordered that the continuous bladder irrigation be continued until the urine was clear.  On March 12, 2001, the patient underwent adenosine Cardiolite exam.  It revealed ejection fraction of 45%.  There was an inferolateral scar as well as ischemia in the same area.  Luis Abed, M.D., felt that the patient would definitely need a catheterization; however, in view of the patients renal function it may be best for him to go home first.  On March 13, 2001, the patient was again seen by Dr. Myrtis Ser.  He noted that history hemoglobin was 8.5 and is slowly trending down. The patient denied any chest pain or shortness of breath.  On March 14, 2001, the patient was seen by Dr. Lewayne Bunting.  Noted the patients hemoglobin was 9.2 with hematocrit of 26.8.  He changed the patients iron sulfate to ferrous gluconate 325 mg p.o. t.i.d. and added vitamin C 500 mg once daily.  He also increased the patients Lopressor to 25 mg p.o. b.i.d. He noted that the patients renal function had improved slightly to a BUN of 29 and a creatinine of 2.0.  The next day, Dr. Andee Lineman again saw the patient.  He noted that the patient had a brief run of nonsustained v. tach.  He was asymptomatic with this.  The patients hemoglobin was 9.3.  He agreed with the plan to defer catheterization pending further improvement of the patients renal function. He instructed the patient that if chest pain should return, he should go to the hospital in [place], [place].  The following day, the patient was seen by Dr. Brunilda Payor.  Foley had been removed and the patient was voiding well on his own.  He felt from a urological standpoint, the patient could be discharged home on Septra DS.  The patient was then seen by Dietrich Pates, M.D.  She felt that the patient was stable  for discharge.  She reviewed with the patient that he would need a catheterization and instructed him on what symptoms to watch for that would indicate return for catheterization.  DISCHARGE MEDICATIONS: 1. Septra DS one tablet b.i.d. until gone. 2. Enteric coated aspirin 81 mg q.d. 3. Ferrous gluconate t.i.d. 4. Vitamin C 500 mg q.d. 5. Lopressor 25 mg p.o. b.i.d. 6. Albuterol as previously taken. 7. Nitroglycerin as needed. 8. Amaryl as previously taken.  DISCHARGE INSTRUCTIONS: 1. The patient was advised to fully return to his normal level of activity. 2. He was advised to eat a low fat, low cholesterol diet. 3. He was advised to discontinue taking Accupril. 4. He is to follow up with Dr. Dennie Maizes as needed or scheduled. 5. He is to follow up with M. Linna Darner, M.D., as needed or scheduled. 6. He is to follow up with Roxanne Mins, P.A.C., in the heart center on    Friday, March 18, 2001, at 11:15 in the morning for checkup and repeat    CBC and platelet count.  LAB VALUES:  White count 7.4, hemoglobin 8.3, hematocrit 24.7, platelets 327. Sodium 138, potassium 4.8, chloride 104, CO2 30, BUN 24, creatinine 1.7, glucose 97.  Chest x-ray revealed diffusely increased markings which appeared chronic. There was a retrocardiac density which was suspected to represent atelectasis, scarring, or possible infiltrate.  Radiology recommended a follow-up PA and lateral exam.  Electrocardiogram revealed normal sinus rhythm with right bundle branch block. There were some inferolateral T-wave abnormalities.  Heart rate was 97, PR interval was 0.128, QRS 0.122, QTC 0.442, axis 111. DD:  03/16/01 TD:  03/16/01 Job: [id number]

## 2011-02-20 NOTE — Cardiovascular Report (Signed)
NAMETevin [name], Jimmy [name]                ACCOUNT NO.:  [id number]   MEDICAL RECORD NO.:  [id number]          PATIENT TYPE:  INP   LOCATION:  6531                         FACILITY:  [facility]   PHYSICIAN:  [name], M.D.   DATE OF BIRTH:  April 22, 1930   DATE OF PROCEDURE:  10/21/2005  DATE OF DISCHARGE:                              CARDIAC CATHETERIZATION   PRIMARY CARE PHYSICIAN:  Dr. Linna Darner.   HISTORY OF PRESENT ILLNESS:  Jimmy [name] is a 77 year old man who had a non-  ST-segment elevation myocardial infarction, was transferred from [facility] for heart catheterization.   PROCEDURE PERFORMED:  1.  Left heart catheterization.  2.  Selective coronary angiography.   DETAILS OF THE PROCEDURE:  After obtaining informed consent the patient was  brought to the cardiac catheterization laboratory in the fasting laboratory.  There, he was prepped and draped in the usual sterile manner and a local  anesthetic was obtained over the right groin using 1% lidocaine without  epinephrine. The right femoral artery was cannula using the modified  Seldinger technique with a 6-French 10-cm sheath and left heart  catheterization was performed using a 6-French Judkins' left #4 and a 6-  [place] Judkins' right #4.  At the conclusion of the procedure the catheters  were removed. The patient was moved back to the holding area where the  femoral artery and sheath were removed. Hemostasis was obtained during  direct manual pressure. At the conclusion of the hold there was no evidence  of ecchymosis or hematoma formation. Distal pulses were intact. Total  fluoroscopic time was 3.9 minutes. Total iodinized contrast used with 100  mL.   RESULTS:  Aortic pressure 132/82 with mean of 105 mmHg.   SELECTIVE CORONARY ANGIOGRAPHY:  The left main coronary artery is a large  caliber vessel which has luminal irregularities.   The left anterior descending coronary artery is a small caliber vessel which  is  severely and diffusely diseased with a 75% stenosis in its proximal  portion and a 75% stenosis in its mid portion. There are several diagonal  branches which appear to be only partially filled due to the severity of the  stenosis.   The ramus intermedius artery has a 75% stenosis in its proximal portion and  is a moderate caliber vessel.   The circumflex coronary artery is severely diseased with a 95% proximal  stenosis. There are several branch vessels which have severe disease as  well, including an first obtuse marginal.   The right coronary artery is a small dominant vessel which has a mid 100%  stenosis. There is faint filling of the posterior descending coronary  arteries from both the right and left injections.   There is extensive left to right collateralization seen.   A left ventriculogram was not done, however the patient received an  echocardiogram which showed an EF of 50% to 55%.   ASSESSMENT:  1.  Severe three-vessel coronary artery disease.  2.  Preserved left ventricular systolic function on echocardiogram.   PLAN:  Refer him for consideration for bypass surgery. I am  afraid his  distal vessels appear to have relatively poor targets.      [name], M.D.  Electronically Signed     JH/MEDQ  D:  10/21/2005  T:  10/21/2005  Job:  [id number]

## 2011-02-20 NOTE — Consult Note (Signed)
[place]. [facility]  Patient:    Jimmy [name], Jimmy [name]                      MRN: [id number] Adm. Date:  [id number] Attending:  Daisey Must CC:         Daisey Must, M.D. [facility]                          Consultation Report  REASON FOR CONSULTATION:  Gross hematuria.  HISTORY OF PRESENT ILLNESS:  The patient is a 77 year old male who had a TURP at [facility] on March 07, 2001.  Postoperatively, he developed chest pain and was diagnosed to have a myocardial infarction.  The patient was then transferred to [facility] for further treatment.  Last night, he started having gross hematuria.  His CBI was then started and his urine is now clearing up.  The Foley catheter is now draining well.  The heparin has been discontinued and the cardiac catheterization has been postponed.  Since the patients urine is clearing up, I would continue the bladder irrigation until his urine is clear, and then discontinue his Foley for a voiding trial when he is stable.  Thank you.  We will follow the patient with you. DD:  03/10/01 TD:  03/11/01 Job: [id number]

## 2011-02-20 NOTE — Discharge Summary (Signed)
NAMECrescencio [name], Jimmy [name]                ACCOUNT NO.:  [id number]   MEDICAL RECORD NO.:  [id number]          PATIENT TYPE:  INP   LOCATION:  A204                          FACILITY:  [facility]   PHYSICIAN:  Osvaldo Shipper, MD     DATE OF BIRTH:  [birth year]   DATE OF ADMISSION:  10/18/2005  DATE OF DISCHARGE:  LH                                 DISCHARGE SUMMARY   The patient is being transferred to [facility].   DISCHARGE DIAGNOSES:  1.  Acute coronary syndrome.  2.  Diabetes type 2, newly diagnosed.  3.  History of hypertension.  4.  Past history of coronary artery disease.  5.  Acute bronchitis.  6.  Questionable history of asthma.  7.  History of chronic obstructive pulmonary disease.   Please see H&P dictated earlier today for details regarding the patient's  presenting illness.   BRIEF HOSPITAL COURSE:  The patient is a 77 year old Caucasian male who was  admitted earlier today with symptoms of acute bronchitis.  He was put on  nebulized treatment, IV steroids and IV antibiotics.  He was noted to have  hyperglycemia and he does not have a history of diabetes.  Hence, we  obtained a hemoglobin A1c level which was 6.3.   Earlier this evening the patient was complaining of some shortness of breath  and some left arm numbness.  The patient was seen by Dr. Josefine Class who  ordered an EKG on the patient and ordered an echocardiogram based on his  previous history of coronary artery disease.  The EKG showed diffuse ST and  T-wave abnormalities with intraventricular conduction delay.  We do not have  an old EKG to compare and it was not sure if this was old or new.  Later  this evening the patient started complaining of chest pain which was dull,  6/10 in intensity radiating to the left arm.  The patient was given one  sublingual nitroglycerin with complete relief of pain.  He was found to be a  little bit tachycardiac at about 110.  His other vital signs were all  stable.  Another  EKG was done which showed again similar changes as before  but with some difference in the anterior leads with not as much ST  depression as seen before.  Based on the fact that the patient was having  pain typical for angina, we obtained cardiac enzymes.  Cardiac enzymes came  back positive.  CK-MB 30 and his troponin is more than 2.  Based on these  findings, the patient was started on IV heparin, given Metoprolol IV to  control his heart rate, knowing that it could potentially worsen his  bronchitis but he probably would benefit from a beta-blocker at this time.  He was given an aspirin.  Nitro paste was started.   Dr. Diona Browner from [facility] was then called, was consulted over the  phone and it was recommended that the patient be transferred to [facility] for further workup and evaluation.  He agreed with current  management.  He said that  Integrilin is not needed at this time since the  patient is stable.  If the patient has a recurrence of pain, Integrilin may  be started at that time.  We have initiated a transfer process.   The patient has chronic renal insufficiency which also it is not clear if it  old or new but it is likely chronic renal insufficiency.  Dr. Diona Browner  recommended starting IV fluids in the form of half normal saline to improve  his kidney function before possibly undergoing cardiac catheterization.   In the interim his bronchitis seems to be somewhat better.  He continues to  be on treatment for the same.   For his diabetes I am just starting him on  sliding scale insulin at this  time.  He will also need to be on an oral hypoglycemic agent.  Metformin is  contra-indicated in this patient because of his renal insufficiency.   Further disposition will be decided at [facility].   DISCHARGE MEDICATIONS:  Will be determined at [facility].      Osvaldo Shipper, MD  Electronically Signed     GK/MEDQ  D:  10/19/2005  T:   10/20/2005  Job:  [id number]   cc:   Jonelle Sidle, M.D. [facility]  [address]  [place]  [place] [address]

## 2011-02-20 NOTE — H&P (Signed)
NAMERodderick [name], Jimmy [name]                ACCOUNT NO.:  [id number]   MEDICAL RECORD NO.:  [id number]          PATIENT TYPE:  INP   LOCATION:  6531                         FACILITY:  [facility]   PHYSICIAN:  Jonelle Sidle, M.D. LHCDATE OF BIRTH:  25-Apr-1930   DATE OF ADMISSION:  10/20/2005  DATE OF DISCHARGE:                                HISTORY & PHYSICAL   PRIMARY CARE PHYSICIAN:  Dr. Erasmo Downer, [place], [place].   REASON FOR ADMISSION:  Transfer from [facility] with enzymatic  evidence of a non ST elevation myocardial infarction.   HISTORY OF PRESENT ILLNESS:  Jimmy [name] is a 77 year old male with a  history of hypertension, remote tobacco use, diet managed type 2 diabetes  mellitus, chronic renal insufficiency, reported asthma and apparent coronary  artery disease based on history of non ST elevation myocardial infarction  back in June of 2002. At that time the patient underwent a Cardiolite study  which revealed an ejection fraction of 46% with inferior lateral ischemia.  Cardiac catheterization was deferred at that time given concurrent problems  with hematuria, recent TURP, and renal insufficiency. Medical therapy was  planned.   [name] has not had regular cardiology follow up although apparently  has not had any significant changes in symptoms of chronic shortness of  breath. Recently he developed significant increased worsening of his dyspnea  and left arm and chest discomfort which was new. He presented to [facility] and was initially managed for possible bronchitis with antibiotic  therapy. However, given continued symptoms of left arm discomfort and chest  pain, he was ultimately noted to have evidence of a non ST elevation  myocardial infarction with a troponin I level increasing to 4.88 and  electrocardiogram showing right bundle branch block pattern with inferior  lateral ST segment depression. The patient was stabilized medically and  transfer was arranged to our facility in anticipation of diagnostic coronary  angiography. Of note, the patient's creatinine has been up to 2.0 although  has trended down slightly to 1.9 today. He has had stuttering symptoms since  yesterday on heparin.   ALLERGIES:  No known drug allergies.   MEDICATIONS AT HOME:  1.  Norvasc 5 mg p.o. daily.  2.  Albuterol meter dose inhaler p.r.n.  3.  Lopressor 25 mg p.o. b.i.d.  4.  Aspirin 81 mg p.o. daily.  5.  Sublingual nitroglycerin 0.4 mg p.r.n.  6.  He is also presently on a heparin drip.   PAST MEDICAL HISTORY:  1.  Coronary artery disease as outlined above.  2.  Hypertension.  3.  Diet managed type 2 diabetes mellitus.  4.  Chronic renal insufficiency.  5.  Reported history of asthma with remote tobacco use.  6.  History of hematuria with TURP back in 2002.  7.  Status post cholecystectomy.   SOCIAL HISTORY:  The patient lives in [place] with his wife. He has a 30  pack year history of smoking but quit 20 years ago. He is retired from [employer]. He denies any significant alcohol or illicit substance  use.   FAMILY HISTORY:  Noncontributory for premature cardiovascular disease.   REVIEW OF SYSTEMS:  As described in the history of present illness. He has  not have any active bleeding problems. He denies any productive cough,  fevers or chills. He has had no hemoptysis. Appetite has been normal. He  uses meter dose inhalers on an as needed basis and has chronic shortness of  breath with occasional wheezing by report. He denies any orthopnea or PND,  palpitations or syncope. Systems otherwise negative.   PHYSICAL EXAMINATION:  VITAL SIGNS:  Blood pressure elevated at 165/83,  heart rate is 100 and regular in sinus rhythm. The patient is afebrile.  Respirations 20.  GENERAL:  This is an elderly male in no acute distress.  HEENT:  Conjunctivae and lids are normal. Oropharynx is clear.  NECK:  Supple without elevated jugular  venous pressure or loud carotid  bruits. No thyromegaly or thyroid tenderness is noted.  LUNGS:  Are essentially clear with no active wheezing.  CARDIAC EXAM:  Reveals a regular rate and rhythm with soft basal systolic  murmur. Question soft S3 gallop heard also. No pericardial rub is noted.  ABDOMEN:  Soft with no hepatomegaly, no bruits.  EXTREMITIES:  Show no pitting edema. Peripheral pulses are 1+.  SKIN:  No ulcerative changes are noted.  MUSCULOSKELETAL:  No kyphosis is noted.  NEUROPSYCHIATRIC:  The patient is alert and oriented x3. Affect is normal.   LABORATORY DATA:  WBC is 11.9 thousand, hemoglobin 14.3, hematocrit 40.7,  platelets 177,000. Sodium 135, potassium 4.3, chloride 106, bicarb 24,  glucose 184, BUN 33, creatinine 1.9. Hemoglobin A1C 6.4%. CK up to 513, CK-  MB 26.1, troponin I 4.88, BNP 129.   Chest x-ray from the 15th shows mild cardiac enlargement with chronic  appearing interstitial changes and probable atelectasis at the right upper  lobe region.   Repeat electrocardiogram at this facility shows sinus rhythm at 92 beats per  minute with a right bundle branch block pattern which is old, and inferior  lateral ST segment depression with T wave inversion.   IMPRESSION:  1.  Evidence of a non ST elevation myocardial infarction as discussed above      in a 77 year old male with hypertension, prior tobacco use, diet managed      type 2 diabetes mellitus, uncertain lipid status and apparent underlying      coronary artery disease based on previous non ST elevation myocardial      infarction in June of 2002 with abnormal Cardiolite at that time      indicating inferolateral ischemia and an ejection fraction of 46%.  2.  Chronic renal insufficiency, most recent creatinine of 1.9.  3.  Reported history of asthma with report tobacco use history. Patient is     on meter dose inhalers at home. The patient has been managed recently      with antibiotics for possible  bronchitis flare.   PLAN:  1.  The patient is now admitted. We will continue to cycle cardiac markers      and follow up serial electrocardiograms.  2.  Treat with aspirin, heparin, nitroglycerin, Lopressor, Norvasc,      Integrilin and oxygen.  3.  Follow up fasting lipid profile and try to obtain echocardiogram from      [facility]. The patient states that this was performed earlier      today.  4.  Would anticipate Statin therapy and possibly oral hypoglycemic therapy  going forward.  5.  Will plan pre-catheterization Mucomyst and bicarbonate protocol      hydration given diabetes and renal insufficiency. Follow up creatinine      in the morning.  6.  Anticipate possible cardiac catheterization tomorrow with limited      contrast and no ventriculogram.  7.  Will continue antibiotic course for the time being and Xopenex nebulizer      treatments as needed.  8.  Further plans to follow.           ______________________________  Jonelle Sidle, M.D. [facility]     SGM/MEDQ  D:  10/20/2005  T:  10/20/2005  Job:  [id number]

## 2011-02-20 NOTE — H&P (Signed)
NAMEBurrell [name], Jimmy [name]                ACCOUNT NO.:  [id number]   MEDICAL RECORD NO.:  [id number]          PATIENT TYPE:  EMS   LOCATION:  ED                            FACILITY:  [facility]   PHYSICIAN:  Osvaldo Shipper, MD     DATE OF BIRTH:  30-Mar-1930   DATE OF ADMISSION:  10/19/2005  DATE OF DISCHARGE:  LH                                HISTORY & PHYSICAL   PRIMARY DOCTOR:  Dr. Linna Dar[place]   ADMITTING DIAGNOSES:  1.  Acute bronchitis.  2.  Hypertension.  3.  Chronic obstructive pulmonary disease.  4.  Questionable history of asthma.   CHIEF COMPLAINT:  Shortness of breath for the past 2 weeks.   HISTORY OF PRESENT ILLNESS:  The patient is a 77 year old Caucasian male who  has a history of COPD, hypertension, coronary artery disease, questionable  history of asthma who was well until Christmas when he started having  progressive shortness of breath with cough. Cough was mostly with whitish  expectoration. No history of new hemoptysis. The patient went to his doctor  about a few days ago when he was prescribed antibiotics and some Mucinex and  asked to continue inhaler use. However, the patient did not show much  improvement. Then yesterday morning his cough worsened. Then last night he  had a real bad coughing spell which triggered a shortness of breath. The  patient had run out of his albuterol inhaler at that time. EMS was called.  The patient was brought into the ED. The patient denied any chest pain,  palpitations, dizziness, nausea, vomiting, abdominal pain. Denied any fever  or chills at home. No sick contacts.   MEDICATIONS AT HOME:  1.  Norvasc 5 mg daily.  2.  Albuterol MDI.  3.  Lopressor 25 mg b.i.d.  4.  Aspirin 81 mg daily.  5.  Nitroglycerin tablets as needed for angina.  6.  The patient was prescribed some antibiotic by his PMD a few days ago.   ALLERGIES:  No known drug allergies.   PAST MEDICAL HISTORY:  1.  Hypertension for about 10 years.  2.  Asthma for about 33 years.  3.  Heart disease diagnosed about 4-5 years ago. He has never had a cardiac      catheterization. He has had a stress test done in the past. Records in      the E-chart suggest that he had a nuclear medicine study which showed a      possible inferolateral ischemia. This was back in 2002.  4.  The patient has had a cholecystectomy done in the past.  5.  History of some urinary bladder procedure for hematuria.   SOCIAL HISTORY:  The patient lives in [place] with his wife. He has about  a 30-pack-year history of smoking, quit 20 years ago. No alcohol use, no  illicit drug use. He is independent with his ADLs.   FAMILY HISTORY:  Nothing contributory in the family history as per the  patient.   REVIEW OF SYSTEMS:  A 10-point review of systems was done  which was  unremarkable except as mentioned in the HPI.   PHYSICAL EXAMINATION:  VITAL SIGNS:  Temperature 97.1, blood pressure  119/64, heart rate over 105 and regular, respiratory rate 24, saturating 96%  on 2 L by nasal canula.  GENERAL:  A well-developed, well-nourished male in no distress, getting  tachypneic while talking.  HEENT:  There is no pallor, no icterus. Oral mucous membranes moist. No oral  lesions are seen.  NECK:  Soft, supple. There is no thyromegaly appreciated.  LUNGS:  Reveal diffuse end-expiratory wheezing bilaterally in all lung  fields. No crackles are appreciated.  CARDIOVASCULAR:  S1, S2, normal, regular, no murmurs appreciated.  EXTREMITIES:  Without any edema.  ABDOMEN:  Soft, nontender, nondistended. Bowel sounds present. No mass or  organomegaly.  NEUROLOGIC:  The patient is alert and oriented x3.   LAB DATA:  His ABG showed pH 7.35, PCO2 43, PO2 80, bicarb 23, oxygen  saturation 95%. CBC shows eosinophilia otherwise unremarkable. Sodium 137,  potassium 4.4, chloride 105, bicarb 27, glucose 218, BUN 20, creatinine 1.7.   Chest x-ray shows myocardiac enlargement, no acute  pulmonary process seen.   IMPRESSION:  This is a 77 year old Caucasian male with the above-mentioned  medical problems who presents with what appears to be acute bronchitis. He  also has hyperglycemia, mild renal insufficiency. We do not have any old  labs on this patient.   PLAN:  Problem 1.  Acute bronchitis. The patient has noted improvement after  being given 3 nebulizer treatments in the ED. We will observe the patient in  the hospital. Continue treatments for now. We will continue steroids.  Continue antibiotics. We will start the patient on Advair. If he truly has a  history of asthma he may benefit from Singulair. I will defer this to his  PMD. Peak flows will be checked as well. Once he is more stable he might  benefit from pulmonary function tests as an outpatient as well.   Problem 2.  Hypertension. I will continue his Norvasc, hold his Lopressor  because of his active wheezing.   Problem 3.  Coronary artery disease. Appears to be stable at this time. We  will defer management to his PMD.   Problem 4.  Hyperglycemia. We will check an A1c level, monitor his CBGs q.  a.c. and h.s. It is possible that this elevation in glucose is likely  secondary to steroids, but we will go ahead and work it up anyway. UA  will  also be checked.   Problem 5.  Deep venous thrombosis and gastrointestinal prophylaxis were  provided.   Problem 6.  Mild renal insufficiency will be followed as well.   The patient is a full code.      Osvaldo Shipper, MD  Electronically Signed     GK/MEDQ  D:  10/19/2005  T:  10/19/2005  Job:  [id number]

## 2011-02-20 NOTE — Op Note (Signed)
NAMESajid Ruppert, Gaspar                ACCOUNT NO.:  [id number]   MEDICAL RECORD NO.:  [id number]          PATIENT TYPE:  INP   LOCATION:  2313                         FACILITY:  [facility]   PHYSICIAN:  Salvatore Decent. Dorris Fetch, M.D.DATE OF BIRTH:  July 25, 1930   DATE OF PROCEDURE:  10/30/2005  DATE OF DISCHARGE:                                 OPERATIVE REPORT   PREOPERATIVE DIAGNOSIS:  Severe three-vessel coronary artery disease, status  post non-Q-wave myocardial infarction.   POSTOPERATIVE DIAGNOSIS:  Severe three-vessel coronary artery disease,  status post non-Q-wave myocardial infarction.   PROCEDURE:  1.  Median sternotomy.  2.  Extracorporeal circulation.  3.  Coronary bypass grafting x2 (left internal mammary artery to diagonal,      saphenous vein graft to obtuse marginal).  4.  Endoscopic vein harvest, left leg.   SURGEON:  Salvatore Decent. Dorris Fetch, M.D.   ASSISTANT'S:  Coral Ceo, P.A.   ANESTHESIA:  General.   FINDINGS:  Diagonal and OM good-quality targets.  No other graftable targets  identified in the LAD, right coronary, ramus or distal posterolateral  distribution, vein of good quality, mammary of good quality.   CLINICAL NOTE:  Mr. Giannelli is a 77 year old gentleman with multiple  medical problems.  He had presented with acute respiratory problems; he has  a history of COPD and asthma, but also ruled in for a non-ST-segment  myocardial infarction.  He was transferred [facility] and underwent catheterization, where he was found to have severe  three-vessel coronary disease with poor target vessels.  He was referred for  coronary artery bypass grafting.  The indications, risks, benefits and  alternatives were discussed in detail with the patient's family.  He  initially needed a pulmonary workup, which delayed surgery somewhat.  He  subsequently had a retroperitoneal bleed while on anticoagulation.  After  discontinuation of  anticoagulation, he continued to have unstable post-  infarct anginal symptoms.  The patient and his family understood the high-  risk nature of the surgery and agreed to proceed.   OPERATIVE NOTE:  Mr. Brandenburg was brought to the preop holding area on  October 31, 2004.  There, he was given intravenous antibiotics.  Lines were  placed for monitoring arterial, central venous and pulmonary arterial  pressure and he then was taken to the operating room, anesthetized and  intubated.  A Foley catheter was placed.  The chest, abdomen and legs were  prepped and draped in the usual fashion.  Because of the hematoma and  ecchymosis in the right groin, the vein was harvested from the left leg.  The saphenous vein was of good quality; it was harvested from just below the  knee to the groin.   Simultaneously, a median sternotomy was performed and the left internal  mammary artery was harvested in standard fashion.  Of note, the patient had  extensive adhesions in his pleural space.  The left mammary artery was a  good-quality conduit.  Five thousand units of heparin were administered  during the takedown of the mammary and harvest of the vein  graft.  There was  excellent flow through the cut end of the mammary artery.   The pericardium was opened.  The remainder of the heparin was administered  to achieve adequate anticoagulation, taking into account aprotinin  utilization.  After confirming adequate anticoagulation with ACT  measurement, the aorta was cannulated via concentric 3-0 Ethibond pledgeted  pursestring sutures.  A dual-stage venous cannula was placed via a  pursestring suture in the right atrial appendage.  Cardiopulmonary bypass  was instituted and patient was cooled to 32 degrees Celsius.  The coronary  arteries were inspected and anastomotic sites were chosen.  The conduits  were inspected and cut to length.  A foam pad was placed in the pericardium  to protect the left main nerve.   A temperature probe was placed in the  myocardial septum and a cardioplegic cannula was placed in the ascending  aorta.   The aorta was crossclamped.  The left ventricle was emptied via the aortic  root vent.  Cardiac arrest then was achieved with a combination of cold  antegrade blood cardioplegia and topical iced saline.  After achieving a  complete diastolic arrest and adequate myocardial septal cooling, inspection  was made and for graftable target vessels; the only 2 vessels that had been  identified as graftable were the large diagonal branch and the large obtuse  marginal branch that were well-visualized on catheterization.  The area of  the distal right coronary and posterior descending artery was dissected out;  there were no graftable targets in this region.  There was a small  posterolateral branch, which was less than a millimeter in diameter, which  was not suitable for grafting.  Inspection of the ramus intermedius  distribution revealed no graftable targets.  The OM was a relatively high  anterolateral branch which then appeared to supply the majority of the  lateral wall.  This large diagonal branch appeared to supply essentially the  entire anterior wall.  Dissection into the myocardium to try to find a LAD  proper did not identify a graftable vessel.   A reversed saphenous vein graft was placed end-to-side to the obtuse  marginal; this was a 2-mm vessel at the site of the anastomosis.  A 1.5-mm  probe passed distally.  The vessel was intramyocardial.  The anastomosis was  performed with a running 7-0 Prolene suture.  There was excellent flow.  Cardioplegia was administered.  There was good hemostasis.   Next, the left internal mammary artery was brought through a window in the  pericardium.  The distal end was spatulated, then was anastomosed end-to-  side to the large diagonal branch, which was the dominant anterior wall vessel; this was a 1.5-mm good-quality target.   The mammary was a good-  quality conduit.  The anastomosis was performed with a running 8-0 Prolene  suture.  At the completion of the anastomosis, the bulldog clamp was briefly  removed to inspect for hemostasis.  Immediate and rapid septal rewarming was  noted.  The bulldog clamp was replaced and the mammary pedicle was tacked to  the epicardial surface of the heart with 6-0 Prolene sutures.   The cardioplegic cannula was removed from the ascending aorta.  The vein  graft was cut to length and the proximal vein graft anastomosis was  performed to a 4.5-mm punch aortotomy with a running 6-0 Prolene suture.   At the completion of the proximal anastomosis, the patient was placed in  Trendelenburg position.  Lidocaine was administered.  The bulldog clamp was  again removed from the left mammary artery.  The aortic root was de-aired  and the aortic crossclamp was removed.  The total crossclamp time was 62  minutes.   While rewarming, all proximal and distal anastomoses were inspected for  hemostasis.  Epicardial pacing wires were placed on the right ventricle and  right atrium.  The areas of the epicardial and myocardial dissection were  oversewn with a 6-0 Prolene sutures.  When the patient reached a core  temperature of 37 degrees Celsius, he was weaned from cardiopulmonary  bypass.  He had been placed on a low-dose dopamine infusion when bypass was  initiated due to his baseline renal insufficiency.  The dopamine infusion  was increased from 3 to 5 mcg/kg per minute and the patient weaned from  bypass without difficulty.  He was in sinus rhythm and had a cardiac index  of greater than 2 L/min per  sq m.  He remained hemodynamically stable  throughout the post bypass period.   A test dose of protamine was administered and was well-tolerated.  The  atrial and aortic cannulae were removed.  The remainder of the protamine was  administered without incident.  The chest was irrigated with 1  L of warm  normal saline containing 1 g of vancomycin.  Hemostasis was achieved.  The  pericardium was not reapproximated.  A left pleural and 2 mediastinal chest  tubes were placed through separate subcostal incisions.  The sternum was  closed with interrupted heavy-gauge stainless steel wires.  The remainder of  the incisions were closed in standard fashion.  All sponge, needle and  instrument counts were correct.  At the end of the procedure, the patient  was taken from the operating room to the surgical intensive care unit in  critical, but stable condition.           ______________________________  Salvatore Decent Dorris Fetch, M.D.     SCH/MEDQ  D:  10/30/2005  T:  10/31/2005  Job:  [id number]   cc:   Jonelle Sidle, M.D. [facility]  [address]  [place]  [place] [address]   Erasmo Downer MD

## 2011-02-20 NOTE — Assessment & Plan Note (Signed)
[facility]                            [facility] OFFICE NOTE   NAME:[name], Jimmy J                       MRN:          [id number]  DATE:08/13/2006                            DOB:          01-14-1930    PRIMARY CARDIOLOGIST:  Learta Codding, MD,FACC.   REASON FOR OFFICE VISIT:  Scheduled one month followup.  Please refer to my  office note of July 30, 2006, for full details.  At that time, the  patient presented with complaint of worsening dyspnea, absent of any  associated PND, orthopnea, lower extremity edema.  He also denied any chest  pain.  I referred the patient for blood work and a chest x-ray, none of  which showed any evidence of congestive heart failure.  His chest x-ray was  clear and, in fact, suggested interval clearing of the right sided pleural  effusion.  A BMP level was 96 and the patient's chronic renal insufficiency  remained stable with a BUN of 40 and a creatinine of 1.8.  Jimmy [name]  informs me today that he will be following up with Dr. Orson Aloe later next  month.  From a clinical standpoint, the patient states there has been no  recent exacerbation from his baseline.  He continues to report no chest  pain, nor any PND, orthopnea, or lower extremity edema.   MEDICATIONS:  Unchanged from July 30, 2006.   PHYSICAL EXAMINATION:  VITAL SIGNS:  Blood pressure 122/80, pulse 64  regular, weight 186.  GENERAL:  A 77 year old male sitting upright in no apparent distress.  HEENT:  Normocephalic atraumatic.  NECK:  Palpable carotid pulses without bruits.  No JVD.  LUNGS:  Diffuse expiratory rhonchi with no bibasilar crackles nor wheezes.  HEART:  Regular rate and rhythm (S1 S2).  No significant murmur.  ABDOMEN:  Soft nontender.  EXTREMITIES:  Intact pulses with only trace edema.  NEUROLOGIC:  No focal deficit.   IMPRESSION:  1. Dyspnea.      a.     Suspect underlying lung disease as the primary etiology.      b.      No definite evidence of congestive heart failure by chest or BNP       level.  2. Coronary artery disease, stable.      a.     Status post non-ST elevation myocardial infarction, 2-vessel       coronary artery bypass graft in January 2007.      b.     Preserved left ventricular function.  3. Chronic obstructive pulmonary disease.      a.     Remote tobacco.  4. Hypertension.  5. Hyperlipidemia.  6. Chronic renal insufficiency.   PLAN:  1. At this point, no further cardiac workup is recommended.  The patient      has no definite evidence of congestive heart failure and we will      therefore, defer further recommendations regarding evaluation and      management of his dyspnea to Dr. Seabron Spates.  2. We will plan on having the patient resume  a cardiology followup in 6      months with Dr. Andee Lineman.      Gene Serpe, PA-C  Electronically Signed      Learta Codding, MD,FACC  Electronically Signed   GS/MedQ  DD: 08/13/2006  DT: 08/13/2006  Job #: [id number]   cc:   Seabron Spates, Dr.  Carolynn Comment, Dr.

## 2011-02-20 NOTE — Procedures (Signed)
NAMEDamarkus [name], Jimmy [name]                ACCOUNT NO.:  [id number]   MEDICAL RECORD NO.:  [id number]          PATIENT TYPE:  INP   LOCATION:  A204                          FACILITY:  [facility]   PHYSICIAN:  [name], M.D.   DATE OF BIRTH:  [birth year]   DATE OF PROCEDURE:  10/20/2005  DATE OF DISCHARGE:                                  ECHOCARDIOGRAM   PRIMARY CARE PHYSICIAN:  Dr. Josefine Class.   TAPE NUMBER:  [id number].  TAPE COUNT:  2083 through 2511.   This is a 77 year old man with evaluation for LV systolic dysfunction.  Technical quality of the study is difficulty, poor acoustic windows.   M-MODE TRACING:  The aorta is 36 mm.   The left atrium is 42 mm.   The septum is 18 mm.   Posterior wall is 16 mm.   Left ventricular diastolic dimension is 48 mm.   Left ventricular systolic dimension is 40 mm.   TWO-DIMENSIONAL AND DOPPLER IMAGING:  The left ventricle is normal size.  There is mildly depressed LV systolic function at low normal levels with an  EF of 50 to 55%. There is inferior hypokinesis and inferior posterior  hypokinesis. There is moderate left ventricular hypertrophy that is  concentric.   The right ventricle is normal size with normal systolic function.   Both atria actually appear mildly enlarged.   The aortic valve is sclerotic with no stenosis or regurgitation.   The mitral valve has mild regurgitation.   The tricuspid had mild regurgitation.   There is no pericardial effusion.      [name], M.D.  Electronically Signed     JH/MEDQ  D:  10/20/2005  T:  10/21/2005  Job:  [id number]   cc:   Hanley Hays. Josefine Class, M.D.  Fax: [phone]

## 2011-02-20 NOTE — Assessment & Plan Note (Signed)
[facility]                            [facility] NOTE   NAME:Jimmy [name], Jimmy [name]                       MRN:          [id number]  DATE:07/30/2006                            DOB:          02-22-1930    PRIMARY CARDIOLOGIST:  Dr. Lewayne Bunting.   REASON FOR OFFICE VISIT:  Scheduled 9-month follow up.   HISTORY:  Jimmy [name] is a very pleasant 77 year old male, with known  coronary artery disease status post 2-vessel CABG in January of this year,  with a complicated postoperative course.  Last seen here in the clinic in  March, for MI, and PACs. The patient now presents with complaint of  worsening dyspnea over the past week or so. Nevertheless, he denies any PND,  orthopnea, lower extremity edema. He has also lost a significant amount of  weight by placing himself on a low carbohydrate diet. He has lost  approximately 15 pounds since last seen in our clinic.   The patient has not smoked in 30 years, but does have a diagnosis of  asthma and was recently placed on a new prescription of albuterol MDI,  which patient apparently has been taking for quite some time. He was also  placed on antibiotics and Mucinex but with only some amelioration of his  symptoms.   The patient denies any exertional angina pectoris. He denies any tachy  palpitations.   CURRENT MEDICATIONS:  1. Aspirin 325 daily.  2. Lopressor 25 b.i.d.  3. Vytorin 10/20 daily.  4. Lasix 40 b.i.d.  5. Potassium chloride 20 mEq daily.  6. Prilosec over-the-counter 20 mg b.i.d.   PHYSICAL EXAMINATION:  VITAL SIGNS: Blood pressure 118/76, pulse 60. Weight  188 (down 15 pounds since last visit).  GENERAL:  A 77 year old male sitting upright in no apparent distress.  HEENT:  Normocephalic, atraumatic.  NECK:  Palpable carotid pulses without bruits.  LUNGS:  Diffuse expiratory wheezes both anteriorly and posteriorly.  HEART:  Regular rate and rhythm S1, S2. No significant murmur.  ABDOMEN:  Soft, nontender.  EXTREMITIES:  Intact pulses without edema.  NEURO: Alert and oriented without focal deficit.   IMPRESSION:  1. Dyspnea.      a.     Suspect secondary to chronic obstructive pulmonary disease       exacerbation.  2. Coronary artery disease.      a.     Non ST elevation myocardial infarction.      b.     Two-vessel coronary artery bypass graft in January, 2007; LIMA -       diagonal; Saphenous vein graft - obtuse marginal.      c.     Preserved left ventricular function by 2-D echocardiography.      d.     Complicated postoperative course: paroxysmal atrial       fibrillation, nonsustained ventricular tachycardia, acute renal       insufficiency, anemia, and sternal wound infection.  3. Chronic obstructive pulmonary disease/asthma.      a.     Remote tobacco.  4. Hyperlipidemia.      a.  Follow up by Dr. Linna Darner.  5. Hypertension - well controlled.  6. History of renal insufficiency.   PLAN:  1. Stat 2-view chest x-ray to exclude pulmonary edema as the etiology for      his acute wheezing. Although I suspect that this is primarily of      pulmonary etiology with COPD exacerbation, it could be that he presents      with cardiogenic asthma. Of note, he is already on a diuretic regimen.  2. A BMET and BNP level, the latter to exclude possible congestive heart      failure. Of note, patient does have history of renal insufficiency.  3. Decrease aspirin to low-dose.  4. Return to clinic and follow up with me in the next 3-1/2 to 4 weeks for      review of test results and further recommendations.     ______________________________  Rozell Searing, PA-C    ______________________________  Learta Codding, MD,FACC   GS/MedQ  DD: 07/30/2006  DT: 08/01/2006  Job #: [id number]   cc:   Erasmo Downer, M.D.

## 2011-03-04 ENCOUNTER — Ambulatory Visit: Payer: Self-pay | Admitting: Cardiology

## 2011-03-16 ENCOUNTER — Encounter: Payer: Self-pay | Admitting: Adult Health

## 2011-03-16 ENCOUNTER — Ambulatory Visit (INDEPENDENT_AMBULATORY_CARE_PROVIDER_SITE_OTHER): Payer: Medicare Other | Admitting: Adult Health

## 2011-03-16 DIAGNOSIS — Z951 Presence of aortocoronary bypass graft: Secondary | ICD-10-CM

## 2011-03-16 DIAGNOSIS — I10 Essential (primary) hypertension: Secondary | ICD-10-CM

## 2011-03-16 DIAGNOSIS — N259 Disorder resulting from impaired renal tubular function, unspecified: Secondary | ICD-10-CM

## 2011-03-16 DIAGNOSIS — E785 Hyperlipidemia, unspecified: Secondary | ICD-10-CM

## 2011-03-16 DIAGNOSIS — I2510 Atherosclerotic heart disease of native coronary artery without angina pectoris: Secondary | ICD-10-CM

## 2011-03-16 MED ORDER — PRAVASTATIN SODIUM 40 MG PO TABS: 40.0000 mg | ORAL_TABLET | Freq: Every day | ORAL | Status: DC

## 2011-03-16 NOTE — Assessment & Plan Note (Addendum)
He is doing very well from a cardiac standpoint.  He remains active and is asymptomatic. I see no reason to proceed with any cardiac testing at this time. He will follow up in one year with Dr. Andee Lineman. He wishes for [place] to check lipid status and this will be completed. Will also check kidney fx on lasix.

## 2011-03-16 NOTE — Progress Notes (Signed)
HPI:  [name] is a pleasant 77 y/o CM patient of Dr. Andee Lineman in the [facility] office, we are following with known history CAD with CABG in 2007, normal LV fx.  He is here for annual follow-up.  He remains active, walking 2-miles daily, or more, working out with his son.  He is also followed by Dr. Levie Heritage for primary care and has his cholesterol checked there.  He is medically compliant and  Without complaint of chest pain, fatigue, DOE or dizziness.    No Known Allergies  Current Outpatient Prescriptions  Medication Sig Dispense Refill  . albuterol (PROVENTIL HFA) 108 (90 BASE) MCG/ACT inhaler Inhale 2 puffs into the lungs every 6 (six) hours as needed.        Marland Kitchen alendronate (FOSAMAX) 70 MG tablet Take 70 mg by mouth every 7 (seven) days. Take with a full glass of water on an empty stomach.       Marland Kitchen aspirin 81 MG tablet Take 81 mg by mouth daily.        Marland Kitchen doxycycline (MONODOX) 100 MG capsule Take 100 mg by mouth 2 (two) times daily.        . furosemide (LASIX) 40 MG tablet Take 40 mg by mouth 2 (two) times daily.        Marland Kitchen guaifenesin (HUMIBID E) 400 MG TABS Take 400 mg by mouth every 4 (four) hours.        . Hormone Gel Base GEL by Does not apply route.        . metoprolol (LOPRESSOR) 50 MG tablet Take 50 mg by mouth daily. 1/2 tab bid        . Multiple Vitamins-Minerals (MULTIVITAMIN WITH MINERALS) tablet Take 1 tablet by mouth daily.        Marland Kitchen omeprazole (PRILOSEC) 20 MG capsule Take 20 mg by mouth daily.        . potassium chloride SA (K-DUR,KLOR-CON) 20 MEQ tablet Take 1 tablet (20 mEq total) by mouth daily.  90 tablet  3  . pravastatin (PRAVACHOL) 40 MG tablet Take 1 tablet (40 mg total) by mouth daily.  90 tablet  1  . DISCONTD: pravastatin (PRAVACHOL) 40 MG tablet TAKE 1 TABLET AT BEDTIME  90 tablet  0    Past Medical History  Diagnosis Date  . Coronary artery disease   . Hypertension   . Diabetes mellitus     type 2 diet controlle  . Renal insufficiency     chronic  . Asthma   .  Tobacco abuse   . Hematuria 2002    turp back in 2002  . COPD (chronic obstructive pulmonary disease)   . S/P cholecystectomy     Past Surgical History  Procedure Date  . Transurethral resection of prostate   . Cholecystectomy   . Coronary artery bypass graft 2007    ROS: Review of systems complete and found to be negative unless listed above PHYSICAL EXAM BP 128/72  Pulse 78  Ht 5\' 10"  (1.778 m)  Wt 188 lb (85.276 kg)  BMI 26.98 kg/m2  SpO2 95% General: Well developed, well nourished, in no acute distress Head: Eyes PERRLA, No xanthomas.   Normal cephalic and atramatic  Lungs: Clear bilaterally to auscultation and percussion. Heart: HRRR S1 S2, .  Pulses are 2+ & equal.            No carotid bruit. No JVD.  No abdominal bruits. No femoral bruits. Abdomen: Bowel sounds are positive, abdomen soft and non-tender  without masses or                    Hernia's noted. Msk:  Back normal, normal gait. Normal strength and tone for age. Extremities: No clubbing, cyanosis or edema.  DP +1 Neuro: Alert and oriented X 3. Psych:  Good affect, responds appropriately EKG:NSR mild LVH.  HR 74 bpm.  ASSESSMENT AND PLAN

## 2011-03-16 NOTE — Patient Instructions (Signed)
**Note De-Identified Chue Berkovich Obfuscation** Your physician recommends that you return for lab work in: this week  Your physician recommends that you schedule a follow-up appointment in: 6 months

## 2011-03-20 LAB — LIPID PANEL
Total CHOL/HDL Ratio: 3.1 Ratio
VLDL: 28 mg/dL (ref 0–40)

## 2011-03-20 LAB — BASIC METABOLIC PANEL
Potassium: 4.8 mEq/L (ref 3.5–5.3)
Sodium: 140 mEq/L (ref 135–145)

## 2011-09-08 ENCOUNTER — Encounter: Payer: Self-pay | Admitting: Adult Health

## 2011-09-14 ENCOUNTER — Ambulatory Visit (INDEPENDENT_AMBULATORY_CARE_PROVIDER_SITE_OTHER): Payer: Medicare Other | Admitting: Adult Health

## 2011-09-14 ENCOUNTER — Encounter: Payer: Self-pay | Admitting: Adult Health

## 2011-09-14 DIAGNOSIS — N259 Disorder resulting from impaired renal tubular function, unspecified: Secondary | ICD-10-CM

## 2011-09-14 DIAGNOSIS — E7800 Pure hypercholesterolemia, unspecified: Secondary | ICD-10-CM

## 2011-09-14 DIAGNOSIS — I10 Essential (primary) hypertension: Secondary | ICD-10-CM

## 2011-09-14 DIAGNOSIS — I2510 Atherosclerotic heart disease of native coronary artery without angina pectoris: Secondary | ICD-10-CM

## 2011-09-14 MED ORDER — PRAVASTATIN SODIUM 40 MG PO TABS: 40.0000 mg | ORAL_TABLET | Freq: Every day | ORAL | Status: AC

## 2011-09-14 MED ORDER — FUROSEMIDE 40 MG PO TABS: 40.0000 mg | ORAL_TABLET | Freq: Two times a day (BID) | ORAL | Status: DC

## 2011-09-14 MED ORDER — METOPROLOL TARTRATE 25 MG PO TABS: 25.0000 mg | ORAL_TABLET | Freq: Two times a day (BID) | ORAL | Status: DC

## 2011-09-14 MED ORDER — ALENDRONATE SODIUM 70 MG PO TABS: 70.0000 mg | ORAL_TABLET | ORAL | Status: DC

## 2011-09-14 MED ORDER — POTASSIUM CHLORIDE CRYS ER 20 MEQ PO TBCR: 20.0000 meq | EXTENDED_RELEASE_TABLET | Freq: Every day | ORAL | Status: DC

## 2011-09-14 MED ORDER — ALBUTEROL SULFATE HFA 108 (90 BASE) MCG/ACT IN AERS: 2.0000 | INHALATION_SPRAY | Freq: Four times a day (QID) | RESPIRATORY_TRACT | Status: AC | PRN

## 2011-09-14 NOTE — Progress Notes (Signed)
HPI: [name] is a very pleasant 77 y/o patient of Dr. Andee Lineman who wishes to be followed in the [place] clinic from now on.  He has a history of CAD with CABG in 2007, normal Lv fx, Asthma, and hypercholesterolemia.  He comes today for 6 months follow-up and is without cardiac complaint.  He has had an exacerbation of asthma over the last month and saw his PCP for this.  He has run out of his albuterol inhaler and has no local pharmacy to supply it.  His medications come from [facility]. Unfortunately, his PCP has not refilled it locally. Otherwise, he is doing well without complaints of dizziness, chest pain, edema or weakness.  No Known Allergies  Current Outpatient Prescriptions  Medication Sig Dispense Refill  . albuterol (PROVENTIL HFA) 108 (90 BASE) MCG/ACT inhaler Inhale 2 puffs into the lungs every 6 (six) hours as needed.  1 Inhaler  4  . alendronate (FOSAMAX) 70 MG tablet Take 1 tablet (70 mg total) by mouth once a week. Take with a full glass of water on an empty stomach.  4 tablet  4  . aspirin 81 MG tablet Take 81 mg by mouth daily.        . furosemide (LASIX) 40 MG tablet Take 1 tablet (40 mg total) by mouth 2 (two) times daily.  180 tablet  4  . metoprolol (LOPRESSOR) 25 MG tablet Take 1 tablet (25 mg total) by mouth 2 (two) times daily.  180 tablet  4  . Multiple Vitamins-Minerals (MULTIVITAMIN WITH MINERALS) tablet Take 1 tablet by mouth daily.        Marland Kitchen omeprazole (PRILOSEC) 20 MG capsule Take 20 mg by mouth daily.        . potassium chloride SA (K-DUR,KLOR-CON) 20 MEQ tablet Take 1 tablet (20 mEq total) by mouth daily.  90 tablet  4  . pravastatin (PRAVACHOL) 40 MG tablet Take 1 tablet (40 mg total) by mouth daily.  90 tablet  4  . DISCONTD: pravastatin (PRAVACHOL) 40 MG tablet Take 1 tablet (40 mg total) by mouth daily.  90 tablet  1    Past Medical History  Diagnosis Date  . Coronary artery disease   . Hypertension   . Diabetes mellitus     type 2 diet controlle  .  Renal insufficiency     chronic  . Asthma   . Tobacco abuse   . Hematuria 2002    turp back in 2002  . COPD (chronic obstructive pulmonary disease)   . S/P cholecystectomy     Past Surgical History  Procedure Date  . Transurethral resection of prostate   . Cholecystectomy   . Coronary artery bypass graft 2007    [id number]:[id number] of systems complete and found to be negative unless listed above PHYSICAL EXAM BP 146/81  Pulse 73  Ht 5\' 10"  (1.778 m)  Wt 196 lb 1.9 oz (88.959 kg)  BMI 28.14 kg/m2  General: Well developed, well nourished, in no acute distress Head: Eyes PERRLA, No xanthomas.   Normal cephalic and atramatic  Lungs: Bibasilar crackles with expiratory wheezes. Heart: HRRR S1 S2, without MRG.  Pulses are 2+ & equal.            No carotid bruit. No JVD.  No abdominal bruits. No femoral bruits. Abdomen: Bowel sounds are positive, abdomen soft and non-tender without masses or                  Hernia's noted. Msk:  Back normal, normal gait.Mildly diminished strength and tone for age. Extremities: No clubbing, cyanosis or edema.  DP +1 Neuro: Alert and oriented X 3. Psych:  Good affect, responds appropriately    ASSESSMENT AND PLAN

## 2011-09-14 NOTE — Patient Instructions (Signed)
Your physician recommends that you schedule a follow-up appointment in: 6 months  Your physician recommends that you return for lab work in: This week   

## 2011-09-14 NOTE — Assessment & Plan Note (Signed)
He is clinically stable at this time and without symptoms. His BP and HR are well controlled at present. However, he is up 8 lbs since being seen last 6 months ago. He said that he usually gains a little in the winter and loses it again in the warm weather. Continues to walk and watch his salt. I will refill all of his medications and provide a local Refill for albuterol until his [facility] Rx come in.

## 2011-09-14 NOTE — Assessment & Plan Note (Signed)
Will check BMET with his Lipids and LFT's on next blood draw this month.

## 2011-09-14 NOTE — Assessment & Plan Note (Signed)
Most recent values drawn in June were within normal limits and well controlled on current dose of pravastatin.

## 2011-09-17 LAB — HEPATIC FUNCTION PANEL
ALT: 18 U/L (ref 0–53)
AST: 21 U/L (ref 0–37)
Bilirubin, Direct: 0.1 mg/dL (ref 0.0–0.3)
Indirect Bilirubin: 0.3 mg/dL (ref 0.0–0.9)
Total Protein: 6.8 g/dL (ref 6.0–8.3)

## 2011-09-17 LAB — LIPID PANEL
Cholesterol: 136 mg/dL (ref 0–200)
Total CHOL/HDL Ratio: 2.6 Ratio
Triglycerides: 83 mg/dL (ref <150)
VLDL: 17 mg/dL (ref 0–40)

## 2011-09-17 LAB — BASIC METABOLIC PANEL
Calcium: 9.2 mg/dL (ref 8.4–10.5)
Glucose, Bld: 115 mg/dL — ABNORMAL HIGH (ref 70–99)
Potassium: 4.8 mEq/L (ref 3.5–5.3)
Sodium: 141 mEq/L (ref 135–145)

## 2012-02-18 ENCOUNTER — Other Ambulatory Visit: Payer: Self-pay | Admitting: *Deleted

## 2012-02-18 MED ORDER — POTASSIUM CHLORIDE CRYS ER 20 MEQ PO TBCR: 20.0000 meq | EXTENDED_RELEASE_TABLET | Freq: Every day | ORAL | Status: DC

## 2012-03-14 ENCOUNTER — Ambulatory Visit: Payer: Medicare Other | Admitting: Adult Health

## 2012-03-16 ENCOUNTER — Ambulatory Visit (INDEPENDENT_AMBULATORY_CARE_PROVIDER_SITE_OTHER): Payer: Medicare Other | Admitting: Adult Health

## 2012-03-16 ENCOUNTER — Encounter: Payer: Self-pay | Admitting: Adult Health

## 2012-03-16 VITALS — BP 162/93 | HR 67 | Resp 16 | Ht 70.0 in | Wt 199.0 lb

## 2012-03-16 DIAGNOSIS — Z951 Presence of aortocoronary bypass graft: Secondary | ICD-10-CM

## 2012-03-16 DIAGNOSIS — N259 Disorder resulting from impaired renal tubular function, unspecified: Secondary | ICD-10-CM

## 2012-03-16 DIAGNOSIS — E7800 Pure hypercholesterolemia, unspecified: Secondary | ICD-10-CM

## 2012-03-16 DIAGNOSIS — I10 Essential (primary) hypertension: Secondary | ICD-10-CM

## 2012-03-16 NOTE — Assessment & Plan Note (Signed)
He has no cardiac complaints, but is noticing some LEE. He admits to eating out a lot since his wife died. I have asked him to take one extra dose of Lasix 40 mg today with extra dose of potassium, 20 mEq.Jimmy [name] He is up 3 lbs since being seen last. Breathing status is about the same. He is able to get all of his medications without issue now. I have asked him to do his best to avoid salty foods.

## 2012-03-16 NOTE — Progress Notes (Signed)
   HPI: Jimmy [name] is a very pleasant 78 y/o patient of Dr. Andee Lineman who wishes to be followed in the [place] clinic from now on.  He has a history of CAD with CABG in 2007, normal Lv fx, Asthma, and hypercholesterolemia.  He comes today for 6 months follow-up and is without cardiac complaint.  He wife died in 12/04/22 after a long illness. He is eating out a lot more with his family. He has had some complaints of LEE and chronic right knee pain. He denies chest pain or increased work of breathing.  No Known Allergies  Current Outpatient Prescriptions  Medication Sig Dispense Refill  . albuterol (PROVENTIL HFA) 108 (90 BASE) MCG/ACT inhaler Inhale 2 puffs into the lungs every 6 (six) hours as needed.  1 Inhaler  4  . aspirin 81 MG tablet Take 81 mg by mouth daily.        . furosemide (LASIX) 40 MG tablet Take 1 tablet (40 mg total) by mouth 2 (two) times daily.  180 tablet  4  . metoprolol (LOPRESSOR) 25 MG tablet Take 1 tablet (25 mg total) by mouth 2 (two) times daily.  180 tablet  4  . Multiple Vitamins-Minerals (MULTIVITAMIN WITH MINERALS) tablet Take 1 tablet by mouth daily.        . Omega-3 Fatty Acids (FISH OIL PO) Take by mouth daily.      Marland Kitchen omeprazole (PRILOSEC) 20 MG capsule Take 20 mg by mouth daily.        . potassium chloride SA (K-DUR,KLOR-CON) 20 MEQ tablet Take 1 tablet (20 mEq total) by mouth daily.  90 tablet  4  . pravastatin (PRAVACHOL) 40 MG tablet Take 1 tablet (40 mg total) by mouth daily.  90 tablet  4    Past Medical History  Diagnosis Date  . Coronary artery disease   . Hypertension   . Diabetes mellitus     type 2 diet controlle  . Renal insufficiency     chronic  . Asthma   . Tobacco abuse   . Hematuria 2002    turp back in 2002  . COPD (chronic obstructive pulmonary disease)   . S/P cholecystectomy     Past Surgical History  Procedure Date  . Transurethral resection of prostate   . Cholecystectomy   . Coronary artery bypass graft 2007     ZOX:WRUEAV of systems complete and found to be negative unless listed above PHYSICAL EXAM BP 162/93  Pulse 67  Resp 16  Ht 5\' 10"  (1.778 m)  Wt 199 lb (90.266 kg)  BMI 28.55 kg/m2  General: Well developed, well nourished, in no acute distress, edentulous. Head: Eyes PERRLA, No xanthomas.   Normal cephalic and atramatic  Lungs: Bibasilar crackles with expiratory wheezes. Heart: HRRR S1 S2, without MRG.  Pulses are 2+ & equal.            No carotid bruit. No JVD.  No abdominal bruits. No femoral bruits. Abdomen: Bowel sounds are positive, abdomen soft and non-tender without masses or  Hernia's noted. Obese Msk:  Back normal, normal gait.Mildly diminished strength and tone for age. Extremities: No clubbing, cyanosis, positive for 1+ pretibial edema.  DP +1 Neuro: Alert and oriented X 3. Psych:  Good affect, responds appropriately    ASSESSMENT AND PLAN

## 2012-03-16 NOTE — Patient Instructions (Addendum)
**Note De-Identified Charlii Yost Obfuscation** Please take an extra dose of Lasix and Potassium today only.  Your physician recommends that you continue on your current medications as directed. Please refer to the Current Medication list given to you today.  Your physician recommends that you return for lab work in: August  Your physician recommends that you schedule a follow-up appointment in: 6 months

## 2012-03-16 NOTE — Assessment & Plan Note (Signed)
He is now on fish oil supplements. I will have lipid studies done on August.

## 2012-03-16 NOTE — Assessment & Plan Note (Signed)
He will have labs completed in August for renal function on lasix with CAD. He wishes to wait until then as he has labs drawn 4 months ago and is worried about his insurance coverage for this.

## 2012-06-01 ENCOUNTER — Other Ambulatory Visit: Payer: Self-pay | Admitting: Adult Health

## 2012-06-01 LAB — BASIC METABOLIC PANEL
Chloride: 104 mEq/L (ref 96–112)
Creat: 1.85 mg/dL — ABNORMAL HIGH (ref 0.50–1.35)
Potassium: 4.5 mEq/L (ref 3.5–5.3)

## 2012-06-01 LAB — HEPATIC FUNCTION PANEL
ALT: 20 U/L (ref 0–53)
Albumin: 4.5 g/dL (ref 3.5–5.2)
Alkaline Phosphatase: 61 U/L (ref 39–117)
Total Protein: 6.9 g/dL (ref 6.0–8.3)

## 2012-06-01 LAB — LIPID PANEL
HDL: 43 mg/dL (ref >39)
LDL Cholesterol: 78 mg/dL (ref 0–99)
Triglycerides: 127 mg/dL (ref <150)

## 2012-09-14 ENCOUNTER — Ambulatory Visit (INDEPENDENT_AMBULATORY_CARE_PROVIDER_SITE_OTHER): Payer: Medicare Other | Admitting: Adult Health

## 2012-09-14 ENCOUNTER — Encounter: Payer: Self-pay | Admitting: Adult Health

## 2012-09-14 VITALS — BP 160/92 | HR 68 | Ht 70.0 in | Wt 211.0 lb

## 2012-09-14 DIAGNOSIS — E7800 Pure hypercholesterolemia, unspecified: Secondary | ICD-10-CM

## 2012-09-14 DIAGNOSIS — I2510 Atherosclerotic heart disease of native coronary artery without angina pectoris: Secondary | ICD-10-CM

## 2012-09-14 DIAGNOSIS — I10 Essential (primary) hypertension: Secondary | ICD-10-CM

## 2012-09-14 DIAGNOSIS — Z951 Presence of aortocoronary bypass graft: Secondary | ICD-10-CM

## 2012-09-14 MED ORDER — AMLODIPINE BESYLATE 5 MG PO TABS: 5.0000 mg | ORAL_TABLET | Freq: Every day | ORAL | Status: DC

## 2012-09-14 NOTE — Assessment & Plan Note (Signed)
We were following his cholesterol status, her primary care physician is being seen more often. He is due to have labs in 3 months. He is due for lipids and LFTs at that time. I have asked him to go ahead and have them completed during the usual blood draw in 3 months with PCP with records to be sent to [place] after results are available.

## 2012-09-14 NOTE — Assessment & Plan Note (Signed)
Blood pressure is elevated, with 17 pound weight gain. He also has some pretibial edema more so on the right than on the left. He admits to needing a lot causing weight gain. I explained to him and we will need to add another medication to his regimen to have better blood pressure control with his known history of CAD. He will and Norvasc 5 mg one by mouth daily. Would not add an ACE inhibitor with history of renal insufficiency with most recent creatinine of 1.8. He is followed in the by primary care for ongoing labs and is due to have another blood draw in 3 months. He will come back in the office next week for a nurse visit with a blood pressure check. He may have some more edema in the lower extremities as a result of amlodipine. Will monitor for this. Most recent echocardiogram was completed in 2007. We will consider repeating that we see him again next week for blood pressure evaluation, as he has had some fluid retention.

## 2012-09-14 NOTE — Progress Notes (Deleted)
Name: Jimmy [name]    DOB: 08-24-1930  Age: 78 y.o.  MR#: [id number]       PCP:  Toma Deiters, MD      Insurance: @PAYORNAME @   CC:   No chief complaint on file.   VS BP 160/92  Pulse 68  Ht 5\' 10"  (1.778 m)  Wt 211 lb (95.709 kg)  BMI 30.28 kg/m2  Weights Current Weight  09/14/12 211 lb (95.709 kg)  03/16/12 199 lb (90.266 kg)  09/14/11 196 lb 1.9 oz (88.959 kg)    Blood Pressure  BP Readings from Last 3 Encounters:  09/14/12 160/92  03/16/12 162/93  09/14/11 146/81     Admit date:  (Not on file) Last encounter with RMR:  06/01/2012   Allergy No Known Allergies  Current Outpatient Prescriptions  Medication Sig Dispense Refill  . aspirin 81 MG tablet Take 81 mg by mouth daily.        . furosemide (LASIX) 40 MG tablet Take 1 tablet (40 mg total) by mouth 2 (two) times daily.  180 tablet  4  . metoprolol (LOPRESSOR) 25 MG tablet Take 1 tablet (25 mg total) by mouth 2 (two) times daily.  180 tablet  4  . Multiple Vitamins-Minerals (MULTIVITAMIN WITH MINERALS) tablet Take 1 tablet by mouth daily.        . Omega-3 Fatty Acids (FISH OIL PO) Take by mouth daily.      Marland Kitchen omeprazole (PRILOSEC) 20 MG capsule Take 20 mg by mouth daily.        . potassium chloride SA (K-DUR,KLOR-CON) 20 MEQ tablet Take 1 tablet (20 mEq total) by mouth daily.  90 tablet  4  . pravastatin (PRAVACHOL) 40 MG tablet Take 1 tablet (40 mg total) by mouth daily.  90 tablet  4  . albuterol (PROVENTIL HFA) 108 (90 BASE) MCG/ACT inhaler Inhale 2 puffs into the lungs every 6 (six) hours as needed.  1 Inhaler  4    Discontinued Meds:   There are no discontinued medications.  Patient Active Problem List  Diagnosis  . DM  . CAD  . RBBB  . ASTHMA  . COPD  . RENAL INSUFFICIENCY  . CORONARY ARTERY BYPASS GRAFT, HX OF  . Hypercholesterolemia    LABS No visits with results within 3 Month(s) from this visit. Latest known visit with results is:  Orders Only on 06/01/2012  Component Date Value  .  Sodium 06/01/2012 141   . Potassium 06/01/2012 4.5   . Chloride 06/01/2012 104   . CO2 06/01/2012 28   . Glucose, Bld 06/01/2012 124*  . BUN 06/01/2012 39*  . Creat 06/01/2012 1.85*  . Calcium 06/01/2012 9.5   . Cholesterol 06/01/2012 146   . Triglycerides 06/01/2012 127   . HDL 06/01/2012 43   . Total CHOL/HDL Ratio 06/01/2012 3.4   . VLDL 06/01/2012 25   . LDL Cholesterol 06/01/2012 78   . Total Bilirubin 06/01/2012 0.4   . Bilirubin, Direct 06/01/2012 0.1   . Indirect Bilirubin 06/01/2012 0.3   . Alkaline Phosphatase 06/01/2012 61   . AST 06/01/2012 23   . ALT 06/01/2012 20   . Total Protein 06/01/2012 6.9   . Albumin 06/01/2012 4.5      Results for this Opt Visit:     Results for orders placed in visit on 06/01/12  BASIC METABOLIC PANEL      Component Value Range   Sodium 141  135 - 145 mEq/L   Potassium 4.5  3.5 - 5.3 mEq/L   Chloride 104  96 - 112 mEq/L   CO2 28  19 - 32 mEq/L   Glucose, Bld 124 (*) 70 - 99 mg/dL   BUN 39 (*) 6 - 23 mg/dL   Creat 1.61 (*) 0.96 - 1.35 mg/dL   Calcium 9.5  8.4 - 04.5 mg/dL  LIPID PANEL      Component Value Range   Cholesterol 146  0 - 200 mg/dL   Triglycerides 409  <811 mg/dL   HDL 43  >91 mg/dL   Total CHOL/HDL Ratio 3.4     VLDL 25  0 - 40 mg/dL   LDL Cholesterol 78  0 - 99 mg/dL  HEPATIC FUNCTION PANEL      Component Value Range   Total Bilirubin 0.4  0.3 - 1.2 mg/dL   Bilirubin, Direct 0.1  0.0 - 0.3 mg/dL   Indirect Bilirubin 0.3  0.0 - 0.9 mg/dL   Alkaline Phosphatase 61  39 - 117 U/L   AST 23  0 - 37 U/L   ALT 20  0 - 53 U/L   Total Protein 6.9  6.0 - 8.3 g/dL   Albumin 4.5  3.5 - 5.2 g/dL    EKG Orders placed in visit on 09/14/12  . EKG 12-LEAD     Prior Assessment and Plan Problem List as of 09/14/2012          Hypercholesterolemia   Last Assessment & Plan Note   03/16/2012 Office Visit Signed 03/16/2012 11:33 AM by Jodelle Gross, NP    He is now on fish oil supplements. I will have lipid studies  done on August.     DM   CAD   Last Assessment & Plan Note   09/14/2011 Office Visit Signed 09/14/2011  1:50 PM by Jodelle Gross, NP    He is clinically stable at this time and without symptoms. His BP and HR are well controlled at present. However, he is up 8 lbs since being seen last 6 months ago. He said that he usually gains a little in the winter and loses it again in the warm weather. Continues to walk and watch his salt. I will refill all of his medications and provide a local Refill for albuterol until his [facility] Rx come in.      RBBB   ASTHMA   COPD   RENAL INSUFFICIENCY   Last Assessment & Plan Note   03/16/2012 Office Visit Signed 03/16/2012 11:32 AM by Jodelle Gross, NP    He will have labs completed in August for renal function on lasix with CAD. He wishes to wait until then as he has labs drawn 4 months ago and is worried about his insurance coverage for this.    CORONARY ARTERY BYPASS GRAFT, HX OF   Last Assessment & Plan Note   03/16/2012 Office Visit Signed 03/16/2012 11:31 AM by Jodelle Gross, NP    He has no cardiac complaints, but is noticing some LEE. He admits to eating out a lot since his wife died. I have asked him to take one extra dose of Lasix 40 mg today with extra dose of potassium, 20 mEq.Marland Kitchen He is up 3 lbs since being seen last. Breathing status is about the same. He is able to get all of his medications without issue now. I have asked him to do his best to avoid salty foods.        Imaging: No results found.  FRS Calculation: Score not calculated. Missing: Total Cholesterol

## 2012-09-14 NOTE — Progress Notes (Signed)
HPI: [name] is a very pleasant 78 year old patient formerly seen in the [facility] office by Dr. Andee Lineman, he wishes to continue in [place] office with Dr. Dietrich Pates. The patient has a history of CAD with CABG in 2007, normal LV function, history of asthma, hypercholesterolemia, and hypertension. Today she comes today for six-month followup is without cardiac complaints. He has gained 17 pounds since being seen last. His wife died in 21-Nov-2022 after a long illness and he has been eating out almost every day. He stopped exercising consistently. He is followed by Dr. Olena Leatherwood as his PCP. He states his blood pressure has been elevated on the last visits with Dr. Olena Leatherwood. PCP is following his labs to include kidney status and cholesterol management. He denies any complaints of chest discomfort, dyspnea on exertion, but admits to feeling more fatigued with the weight gain and lack of exercise.  No Known Allergies  Current Outpatient Prescriptions  Medication Sig Dispense Refill  . aspirin 81 MG tablet Take 81 mg by mouth daily.        . furosemide (LASIX) 40 MG tablet Take 1 tablet (40 mg total) by mouth 2 (two) times daily.  180 tablet  4  . metoprolol (LOPRESSOR) 25 MG tablet Take 1 tablet (25 mg total) by mouth 2 (two) times daily.  180 tablet  4  . Multiple Vitamins-Minerals (MULTIVITAMIN WITH MINERALS) tablet Take 1 tablet by mouth daily.        . Omega-3 Fatty Acids (FISH OIL PO) Take by mouth daily.      Marland Kitchen omeprazole (PRILOSEC) 20 MG capsule Take 20 mg by mouth daily.        . potassium chloride SA (K-DUR,KLOR-CON) 20 MEQ tablet Take 1 tablet (20 mEq total) by mouth daily.  90 tablet  4  . pravastatin (PRAVACHOL) 40 MG tablet Take 1 tablet (40 mg total) by mouth daily.  90 tablet  4  . albuterol (PROVENTIL HFA) 108 (90 BASE) MCG/ACT inhaler Inhale 2 puffs into the lungs every 6 (six) hours as needed.  1 Inhaler  4  . amLODipine (NORVASC) 5 MG tablet Take 1 tablet (5 mg total) by mouth daily.  30  tablet  3    Past Medical History  Diagnosis Date  . Coronary artery disease   . Hypertension   . Diabetes mellitus     type 2 diet controlle  . Renal insufficiency     chronic  . Asthma   . Tobacco abuse   . Hematuria 2002    turp back in 2002  . COPD (chronic obstructive pulmonary disease)   . S/P cholecystectomy     Past Surgical History  Procedure Date  . Transurethral resection of prostate   . Cholecystectomy   . Coronary artery bypass graft 2007    AOZ:HYQMVH of systems complete and found to be negative unless listed above  PHYSICAL EXAM BP 160/92  Pulse 68  Ht 5\' 10"  (1.778 m)  Wt 211 lb (95.709 kg)  BMI 30.28 kg/m2  General: Well developed, well nourished, in no acute distress Head: Eyes PERRLA, No xanthomas.   Normal cephalic and atraumatic, edentulous.  Lungs: Clear bilaterally to auscultation and percussion. Heart: HRRR S1 S2, without MRG.  Pulses are 2+ & equal.            No carotid bruit. No JVD.  No abdominal bruits. No femoral bruits. Abdomen: Bowel sounds are positive, abdomen soft and non-tender without masses or  Hernia's noted. Obese. Msk:  Back normal, normal gait. Normal strength and tone for age. Extremities: No clubbing, cyanosis or  1+edema bilaterally..  DP +1 Neuro: Alert and oriented X 3. Psych:  Good affect, responds appropriately  EKG:NSR with PVC's. Unchanged from prior EKG in  June of 2013.  ASSESSMENT AND PLAN

## 2012-09-14 NOTE — Assessment & Plan Note (Signed)
No cardiac complaints at this time. We will plan to repeat echocardiogram for LV function this was not been completed since 2007.

## 2012-09-14 NOTE — Patient Instructions (Addendum)
Your physician recommends that you schedule a follow-up appointment in: 6 MONTHS WITH KL  Your physician has recommended you make the following change in your medication:   1) START NORVASC 5MG  ONE TABLET DAILY  RETURN TO OFFICE IN ONE WEEK FOR A BLOOD PRESSURE CHECK WITH THE NURSE  START EXERCISE REGIMEN FOR 30 MINUTES DAILY, HOWEVER WORK YOUR WAY UP TO 30 MINUTES GRADUALLY

## 2012-09-21 ENCOUNTER — Ambulatory Visit (INDEPENDENT_AMBULATORY_CARE_PROVIDER_SITE_OTHER): Payer: Medicare Other | Admitting: *Deleted

## 2012-09-21 ENCOUNTER — Encounter: Payer: Self-pay | Admitting: *Deleted

## 2012-09-21 VITALS — BP 130/87 | HR 67 | Ht 70.0 in | Wt 207.8 lb

## 2012-09-21 DIAGNOSIS — I10 Essential (primary) hypertension: Secondary | ICD-10-CM

## 2012-09-21 NOTE — Progress Notes (Signed)
Pt presents today for a blood pressure check due to recent medication changes, denies any complaints,notes in discharge summary as follows:  Your physician recommends that you schedule a follow-up appointment in: 6 MONTHS WITH KL  Your physician has recommended you make the following change in your medication:  1) START NORVASC 5MG  ONE TABLET DAILY  RETURN TO OFFICE IN ONE WEEK FOR A BLOOD PRESSURE CHECK WITH THE NURSE  START EXERCISE REGIMEN FOR 30 MINUTES DAILY, HOWEVER WORK YOUR WAY UP TO 30 MINUTES GRADUALLY   Prior vital signs from prior visit noted as follows:  BP 160/92  Pulse 68  Ht 5\' 10"  (1.778 m)  Wt 211 lb (95.709 kg)  BMI 30.28 kg/m2

## 2013-01-06 ENCOUNTER — Other Ambulatory Visit: Payer: Self-pay | Admitting: *Deleted

## 2013-01-06 DIAGNOSIS — I2510 Atherosclerotic heart disease of native coronary artery without angina pectoris: Secondary | ICD-10-CM

## 2013-01-06 MED ORDER — AMLODIPINE BESYLATE 5 MG PO TABS: 5.0000 mg | ORAL_TABLET | Freq: Every day | ORAL | Status: DC

## 2013-04-05 ENCOUNTER — Ambulatory Visit: Payer: Medicare Other | Admitting: Adult Health

## 2013-04-11 ENCOUNTER — Encounter: Payer: Self-pay | Admitting: Adult Health

## 2013-04-11 ENCOUNTER — Ambulatory Visit (INDEPENDENT_AMBULATORY_CARE_PROVIDER_SITE_OTHER): Payer: Medicare Other | Admitting: Adult Health

## 2013-04-11 VITALS — BP 138/70 | HR 90 | Ht 70.0 in | Wt 204.1 lb

## 2013-04-11 DIAGNOSIS — Z951 Presence of aortocoronary bypass graft: Secondary | ICD-10-CM

## 2013-04-11 DIAGNOSIS — I4891 Unspecified atrial fibrillation: Secondary | ICD-10-CM

## 2013-04-11 DIAGNOSIS — I2510 Atherosclerotic heart disease of native coronary artery without angina pectoris: Secondary | ICD-10-CM

## 2013-04-11 DIAGNOSIS — I10 Essential (primary) hypertension: Secondary | ICD-10-CM

## 2013-04-11 LAB — CBC
MCH: 27.8 pg (ref 26.0–34.0)
Platelets: 185 10*3/uL (ref 150–400)
RBC: 4.96 MIL/uL (ref 4.22–5.81)
RDW: 15.3 % (ref 11.5–15.5)

## 2013-04-11 NOTE — Progress Notes (Signed)
HPI: [name] is a very pleasant 79 year old patient of Dr. Dietrich Pates, whereupon for ongoing assessment and management of CAD with a CABG in 2007, normal LV function, with history of asthma, hypercholesterolemia, and hypertension. He was last seen in December 2013. On last visit the patient had a 17 pound weight gain with pretibial edema. Is to return to the office for recheck with medication adjustments to include addition of Norvasc, and reluctance to start ACE inhibitor with a history of renal insufficiency. Did not return.   He is doing well and is without complaints of chest pain or DOE. He is having trouble with walking everyday due to knee pain. He is medically compliant.   No Known Allergies  Current Outpatient Prescriptions  Medication Sig Dispense Refill  . albuterol (PROVENTIL HFA) 108 (90 BASE) MCG/ACT inhaler Inhale 2 puffs into the lungs every 6 (six) hours as needed.  1 Inhaler  4  . amLODipine (NORVASC) 5 MG tablet Take 1 tablet (5 mg total) by mouth daily.  90 tablet  3  . aspirin 81 MG tablet Take 81 mg by mouth daily.        . furosemide (LASIX) 40 MG tablet Take 1 tablet (40 mg total) by mouth 2 (two) times daily.  180 tablet  4  . metoprolol (LOPRESSOR) 25 MG tablet Take 1 tablet (25 mg total) by mouth 2 (two) times daily.  180 tablet  4  . Multiple Vitamins-Minerals (MULTIVITAMIN WITH MINERALS) tablet Take 1 tablet by mouth daily.        . Omega-3 Fatty Acids (FISH OIL PO) Take by mouth daily.      Marland Kitchen omeprazole (PRILOSEC) 20 MG capsule Take 20 mg by mouth daily.        . potassium chloride SA (K-DUR,KLOR-CON) 20 MEQ tablet Take 1 tablet (20 mEq total) by mouth daily.  90 tablet  4  . pravastatin (PRAVACHOL) 40 MG tablet Take 1 tablet (40 mg total) by mouth daily.  90 tablet  4   No current facility-administered medications for this visit.    Past Medical History  Diagnosis Date  . Coronary artery disease   . Hypertension   . Diabetes mellitus     type 2 diet  controlle  . Renal insufficiency     chronic  . Asthma   . Tobacco abuse   . Hematuria 2002    turp back in 2002  . COPD (chronic obstructive pulmonary disease)   . S/P cholecystectomy     Past Surgical History  Procedure Laterality Date  . Transurethral resection of prostate    . Cholecystectomy    . Coronary artery bypass graft  2007    AVW:UJWJXB of systems complete and found to be negative unless listed above  PHYSICAL EXAM BP 138/70  Pulse 90  Ht 5\' 10"  (1.778 m)  Wt 204 lb 1.9 oz (92.588 kg)  BMI 29.29 kg/m2  SpO2 95% General: Well developed, well nourished, in no acute distress Head: Eyes PERRLA, No xanthomas.   Normal cephalic and atramatic  Lungs: Clear bilaterally with mild bibasilar crackles. No wheezes.  Heart: HRIR S1 S2, without with soft systolic murmur.  Pulses are 2+ & equal.            No carotid bruit. No JVD.   Abdomen: Bowel sounds are positive, abdomen soft and non-tender without masses or                  Hernia's noted.Obese.  Msk:  Back normal, normal gait. Normal strength and tone for age. Extremities: No clubbing, cyanosis or, non-pitting pretibial edema.  DP +1 Neuro: Alert and oriented X 3. Psych:  Good affect, responds appropriately  EKG: Atrial fibrillaiton vs NSR with frequent PAC's RBBB. ( Over read by Dr. Baldemar Friday on site.)  ASSESSMENT AND PLAN

## 2013-04-11 NOTE — Assessment & Plan Note (Signed)
He is without complaints of chest pain or DOE. He is complaining of knee pain and is unable to exercise. He will have labs and echocardiogram as he has not had cardiac testing since 2007.  Consider stress test if echo is abnormal.

## 2013-04-11 NOTE — Assessment & Plan Note (Signed)
Chronic for him, with evidence of atrial fibrillation on EKG vs sinus arrythmia. I have asked Dr. Baldemar Friday over read the EKG on site. We will have cardionet placed on the patient for one week to ascertain cardiac rhythm over several days. He will not be placed on anticoagulation at this time. Echo and BMET will be completed with follow up with Dr. Baldemar Friday in 2-3 weeks.

## 2013-04-11 NOTE — Assessment & Plan Note (Signed)
Excellent control of BP at this time. No changes in medications 

## 2013-04-11 NOTE — Patient Instructions (Addendum)
Your physician recommends that you schedule a follow-up appointment in: 1 month  Your physician has requested that you have an echocardiogram. Echocardiography is a painless test that uses sound waves to create images of your heart. It provides your doctor with information about the size and shape of your heart and how well your heart's chambers and valves are working. This procedure takes approximately one hour. There are no restrictions for this procedure.  Your physician recommends that you return for lab work today.  Your physician has recommended that you wear a holter monitor. Holter monitors are medical devices that record the heart's electrical activity. Doctors most often use these monitors to diagnose arrhythmias. Arrhythmias are problems with the speed or rhythm of the heartbeat. The monitor is a small, portable device. You can wear one while you do your normal daily activities. This is usually used to diagnose what is causing palpitations/syncope (passing out).

## 2013-04-12 LAB — BASIC METABOLIC PANEL
BUN: 41 mg/dL — ABNORMAL HIGH (ref 6–23)
Chloride: 101 mEq/L (ref 96–112)
Creat: 1.86 mg/dL — ABNORMAL HIGH (ref 0.50–1.35)
Glucose, Bld: 140 mg/dL — ABNORMAL HIGH (ref 70–99)

## 2013-04-12 NOTE — Addendum Note (Signed)
Addended by: Dorothey Baseman C on: 04/12/2013 11:12 AM   Modules accepted: Orders

## 2013-04-18 ENCOUNTER — Ambulatory Visit (HOSPITAL_COMMUNITY)
Admission: RE | Admit: 2013-04-18 | Discharge: 2013-04-18 | Disposition: A | Discharge status: Home / Self Care | Payer: Medicare Other | Source: Ambulatory Visit | Attending: Adult Health | Admitting: Adult Health

## 2013-04-18 DIAGNOSIS — J449 Chronic obstructive pulmonary disease, unspecified: Secondary | ICD-10-CM | POA: Insufficient documentation

## 2013-04-18 DIAGNOSIS — I10 Essential (primary) hypertension: Secondary | ICD-10-CM

## 2013-04-18 DIAGNOSIS — I359 Nonrheumatic aortic valve disorder, unspecified: Secondary | ICD-10-CM

## 2013-04-18 DIAGNOSIS — E119 Type 2 diabetes mellitus without complications: Secondary | ICD-10-CM | POA: Insufficient documentation

## 2013-04-18 DIAGNOSIS — J4489 Other specified chronic obstructive pulmonary disease: Secondary | ICD-10-CM | POA: Insufficient documentation

## 2013-04-18 DIAGNOSIS — Z951 Presence of aortocoronary bypass graft: Secondary | ICD-10-CM

## 2013-04-18 DIAGNOSIS — I25810 Atherosclerosis of coronary artery bypass graft(s) without angina pectoris: Secondary | ICD-10-CM | POA: Insufficient documentation

## 2013-04-18 DIAGNOSIS — I2510 Atherosclerotic heart disease of native coronary artery without angina pectoris: Secondary | ICD-10-CM

## 2013-04-18 NOTE — Progress Notes (Signed)
*  PRELIMINARY RESULTS* Echocardiogram 2D Echocardiogram has been performed.  Jimmy [name] 04/18/2013, 9:58 AM

## 2013-04-26 ENCOUNTER — Other Ambulatory Visit: Payer: Self-pay

## 2013-04-26 DIAGNOSIS — I4891 Unspecified atrial fibrillation: Secondary | ICD-10-CM

## 2013-05-10 ENCOUNTER — Other Ambulatory Visit: Payer: Self-pay

## 2013-05-12 ENCOUNTER — Encounter: Payer: Self-pay | Admitting: Adult Health

## 2013-05-12 ENCOUNTER — Ambulatory Visit (INDEPENDENT_AMBULATORY_CARE_PROVIDER_SITE_OTHER): Payer: Self-pay | Admitting: Adult Health

## 2013-05-12 VITALS — BP 132/84 | HR 84 | Ht 70.0 in | Wt 202.0 lb

## 2013-05-12 DIAGNOSIS — I10 Essential (primary) hypertension: Secondary | ICD-10-CM

## 2013-05-12 DIAGNOSIS — I4891 Unspecified atrial fibrillation: Secondary | ICD-10-CM

## 2013-05-12 DIAGNOSIS — N259 Disorder resulting from impaired renal tubular function, unspecified: Secondary | ICD-10-CM

## 2013-05-12 DIAGNOSIS — Z951 Presence of aortocoronary bypass graft: Secondary | ICD-10-CM

## 2013-05-12 NOTE — Patient Instructions (Addendum)
Your physician recommends that you schedule a follow-up appointment in: 3 weeks with Dr. Purvis Sheffield   Your physician recommends that you return for lab work in 2 weeks (CBC)   Your physician has recommended you make the following change in your medication:  1. Start samples of Xarelto 15 mg

## 2013-05-12 NOTE — Assessment & Plan Note (Signed)
Does not Xarelto is adjusted to 15 mg daily in this setting. Creatinine was 1.86.

## 2013-05-12 NOTE — Progress Notes (Signed)
HPI: History Jimmy [name] is a pleasant 79 year old patient of Dr. Dietrich Pates we are following for ongoing assessment and management of CAD, CABG 2007, normal LV function, with history of asthma, hypercholesterolemia, and hypertension. He was last seen in the office 04/11/2013 complaint of chest pain or dyspnea on exertion. EKG revealed evidence of atrial fibrillation, was over read by Dr. Beulah Gandy on site. A CardioNet was placed on the patient for one week to ascertain cardiac rhythm. The patient was also plan for echocardiogram with followup BMET.   Cardiac event monitor demonstrated atrial fibrillation with controlled rate. Echo demonstrated normal LV function with an EF of 55-60% with akinesis to mild dyskinesis of the small segment of the basal inferior lateral myocardium. Left atrium was moderately dilated. Met completed demonstrated sodium 138 potassium 4.4 chloride 101 CO2 29 BUN 41 creatinine 1.8.  CR CL of 41.05.     He comes today without complaints. He remains in atrial fibrillaion.   No Known Allergies  Current Outpatient Prescriptions  Medication Sig Dispense Refill  . albuterol (PROVENTIL HFA) 108 (90 BASE) MCG/ACT inhaler Inhale 2 puffs into the lungs every 6 (six) hours as needed.  1 Inhaler  4  . amLODipine (NORVASC) 5 MG tablet Take 1 tablet (5 mg total) by mouth daily.  90 tablet  3  . aspirin 81 MG tablet Take 81 mg by mouth daily.        . furosemide (LASIX) 40 MG tablet Take 40 mg by mouth daily. Take 40 mg once a day with 40 mg BID every other day, alternating.      . metoprolol (LOPRESSOR) 25 MG tablet Take 1 tablet (25 mg total) by mouth 2 (two) times daily.  180 tablet  4  . Multiple Vitamins-Minerals (MULTIVITAMIN WITH MINERALS) tablet Take 1 tablet by mouth daily.        . Omega-3 Fatty Acids (FISH OIL PO) Take by mouth daily.      Marland Kitchen omeprazole (PRILOSEC) 20 MG capsule Take 20 mg by mouth daily.        . pravastatin (PRAVACHOL) 40 MG tablet Take 1 tablet (40 mg total) by  mouth daily.  90 tablet  4  . potassium chloride SA (K-DUR,KLOR-CON) 20 MEQ tablet Take 1 tablet (20 mEq total) by mouth daily.  90 tablet  4   No current facility-administered medications for this visit.    Past Medical History  Diagnosis Date  . Coronary artery disease   . Hypertension   . Diabetes mellitus     type 2 diet controlle  . Renal insufficiency     chronic  . Asthma   . Tobacco abuse   . Hematuria 2002    turp back in 2002  . COPD (chronic obstructive pulmonary disease)   . S/P cholecystectomy     Past Surgical History  Procedure Laterality Date  . Transurethral resection of prostate    . Cholecystectomy    . Coronary artery bypass graft  2007    ROS Review of systems complete and found to be negative unless listed above  PHYSICAL EXAM BP 132/84  Pulse 84  Ht 5\' 10"  (1.778 m)  Wt 202 lb (91.627 kg)  BMI 28.98 kg/m2  General: Well developed, well nourished, in no acute distress Head: Eyes PERRLA, No xanthomas.   Normal cephalic and atramatic  Lungs: Clear bilaterally to auscultation and percussion. Heart: HRIR S1 S2, without MRG.  Pulses are 2+ & equal.  No carotid bruit. No JVD.  Abdomen: Bowel sounds are positive, abdomen soft and non-tender without masses or                  Hernia's noted. Msk:  Back normal, normal gait. Normal strength and tone for age. Extremities: No clubbing, cyanosis or edema.  DP +1 Neuro: Alert and oriented X 3. Psych:  Good affect, responds appropriately  EKG: Atrial fibrillation rate of 92 bpm.  ASSESSMENT AND PLAN

## 2013-05-12 NOTE — Progress Notes (Deleted)
Name: Jimmy [name]    DOB: October 26, 1929  Age: 79 y.o.  MR#: [id number]       PCP:  Toma Deiters, MD      Insurance: Payor: [employer] / Plan: BLUE MEDICARE [employer] / Product Type: *No Product type* /   CC:    Chief Complaint  Patient presents with  . Coronary Artery Disease  . Hypertension    VS Filed Vitals:   05/12/13 1254  BP: 132/84  Pulse: 84  Height: 5\' 10"  (1.778 m)  Weight: 202 lb (91.627 kg)    Weights Current Weight  05/12/13 202 lb (91.627 kg)  04/11/13 204 lb 1.9 oz (92.588 kg)  09/21/12 207 lb 12 oz (94.235 kg)    Blood Pressure  BP Readings from Last 3 Encounters:  05/12/13 132/84  04/11/13 138/70  09/21/12 130/87     Admit date:  (Not on file) Last encounter with RMR:  04/11/2013   Allergy Review of patient's allergies indicates no known allergies.  Current Outpatient Prescriptions  Medication Sig Dispense Refill  . albuterol (PROVENTIL HFA) 108 (90 BASE) MCG/ACT inhaler Inhale 2 puffs into the lungs every 6 (six) hours as needed.  1 Inhaler  4  . amLODipine (NORVASC) 5 MG tablet Take 1 tablet (5 mg total) by mouth daily.  90 tablet  3  . aspirin 81 MG tablet Take 81 mg by mouth daily.        . furosemide (LASIX) 40 MG tablet Take 40 mg by mouth daily. Take 40 mg once a day with 40 mg BID every other day, alternating.      . metoprolol (LOPRESSOR) 25 MG tablet Take 1 tablet (25 mg total) by mouth 2 (two) times daily.  180 tablet  4  . Multiple Vitamins-Minerals (MULTIVITAMIN WITH MINERALS) tablet Take 1 tablet by mouth daily.        . Omega-3 Fatty Acids (FISH OIL PO) Take by mouth daily.      Marland Kitchen omeprazole (PRILOSEC) 20 MG capsule Take 20 mg by mouth daily.        . pravastatin (PRAVACHOL) 40 MG tablet Take 1 tablet (40 mg total) by mouth daily.  90 tablet  4  . potassium chloride SA (K-DUR,KLOR-CON) 20 MEQ tablet Take 1 tablet (20 mEq total) by mouth daily.  90 tablet  4   No current facility-administered medications for this  visit.    Discontinued Meds:   There are no discontinued medications.  Patient Active Problem List   Diagnosis Date Noted  . Hypertension 09/14/2012  . Hypercholesterolemia 09/14/2011  . RBBB 06/17/2009  . COPD 06/17/2009  . CORONARY ARTERY BYPASS GRAFT, HX OF 06/17/2009  . DM 06/14/2009  . CAD 06/14/2009  . ASTHMA 06/14/2009  . RENAL INSUFFICIENCY 06/14/2009    LABS    Component Value Date/Time   NA 138 04/11/2013 1443   NA 141 06/01/2012 0728   NA 141 09/14/2011 1328   K 4.4 04/11/2013 1443   K 4.5 06/01/2012 0728   K 4.8 09/14/2011 1328   CL 101 04/11/2013 1443   CL 104 06/01/2012 0728   CL 102 09/14/2011 1328   CO2 29 04/11/2013 1443   CO2 28 06/01/2012 0728   CO2 29 09/14/2011 1328   GLUCOSE 140* 04/11/2013 1443   GLUCOSE 124* 06/01/2012 0728   GLUCOSE 115* 09/14/2011 1328   BUN 41* 04/11/2013 1443   BUN 39* 06/01/2012 0728   BUN 50* 09/14/2011 1328  CREATININE 1.86* 04/11/2013 1443   CREATININE 1.85* 06/01/2012 0728   CREATININE 1.73* 09/14/2011 1328   CALCIUM 9.4 04/11/2013 1443   CALCIUM 9.5 06/01/2012 0728   CALCIUM 9.2 09/14/2011 1328   CMP     Component Value Date/Time   NA 138 04/11/2013 1443   K 4.4 04/11/2013 1443   CL 101 04/11/2013 1443   CO2 29 04/11/2013 1443   GLUCOSE 140* 04/11/2013 1443   BUN 41* 04/11/2013 1443   CREATININE 1.86* 04/11/2013 1443   CALCIUM 9.4 04/11/2013 1443   PROT 6.9 06/01/2012 0728   ALBUMIN 4.5 06/01/2012 0728   AST 23 06/01/2012 0728   ALT 20 06/01/2012 0728   ALKPHOS 61 06/01/2012 0728   BILITOT 0.4 06/01/2012 0728       Component Value Date/Time   WBC 5.9 04/11/2013 1443   HGB 13.8 04/11/2013 1443   HCT 40.1 04/11/2013 1443   MCV 80.8 04/11/2013 1443    Lipid Panel     Component Value Date/Time   CHOL 146 06/01/2012 0728   TRIG 127 06/01/2012 0728   HDL 43 06/01/2012 0728   CHOLHDL 3.4 06/01/2012 0728   VLDL 25 06/01/2012 0728   LDLCALC 78 06/01/2012 0728    ABG No results found for this basename: phart, pco2, pco2art, po2, po2art, hco3, tco2,  acidbasedef, o2sat     No results found for this basename: TSH   BNP (last 3 results) No results found for this basename: PROBNP,  in the last 8760 hours Cardiac Panel (last 3 results) No results found for this basename: CKTOTAL, CKMB, TROPONINI, RELINDX,  in the last 72 hours  Iron/TIBC/Ferritin No results found for this basename: iron, tibc, ferritin     EKG Orders placed in visit on 04/26/13  . CARDIAC EVENT MONITOR     Prior Assessment and Plan Problem List as of 05/12/2013     Cardiovascular and Mediastinum   CAD   Last Assessment & Plan   04/11/2013 Office Visit Written 04/11/2013  2:35 PM by Jodelle Gross, NP     He is without complaints of chest pain or DOE. He is complaining of knee pain and is unable to exercise. He will have labs and echocardiogram as he has not had cardiac testing since 2007.  Consider stress test if echo is abnormal.    RBBB   Last Assessment & Plan   04/11/2013 Office Visit Written 04/11/2013  2:37 PM by Jodelle Gross, NP     Chronic for him, with evidence of atrial fibrillation on EKG vs sinus arrythmia. I have asked Dr. Baldemar Friday over read the EKG on site. We will have cardionet placed on the patient for one week to ascertain cardiac rhythm over several days. He will not be placed on anticoagulation at this time. Echo and BMET will be completed with follow up with Dr. Baldemar Friday in 2-3 weeks.    CORONARY ARTERY BYPASS GRAFT, HX OF   Last Assessment & Plan   09/14/2012 Office Visit Written 09/14/2012 11:52 AM by Jodelle Gross, NP     No cardiac complaints at this time. We will plan to repeat echocardiogram for LV function this was not been completed since 2007.    Hypertension   Last Assessment & Plan   04/11/2013 Office Visit Written 04/11/2013  2:37 PM by Jodelle Gross, NP     Excellent control of BP at this time. No changes in medications.      Respiratory   ASTHMA  COPD     Endocrine   DM     Genitourinary   RENAL  INSUFFICIENCY   Last Assessment & Plan   03/16/2012 Office Visit Written 03/16/2012 11:32 AM by Jodelle Gross, NP     He will have labs completed in August for renal function on lasix with CAD. He wishes to wait until then as he has labs drawn 4 months ago and is worried about his insurance coverage for this.      Other   Hypercholesterolemia   Last Assessment & Plan   09/14/2012 Office Visit Written 09/14/2012 11:52 AM by Jodelle Gross, NP     We were following his cholesterol status, her primary care physician is being seen more often. He is due to have labs in 3 months. He is due for lipids and LFTs at that time. I have asked him to go ahead and have them completed during the usual blood draw in 3 months with PCP with records to be sent to [place] after results are available.        Imaging: No results found.

## 2013-05-12 NOTE — Assessment & Plan Note (Addendum)
I have discussed this new diagnosis with the patient. His CHAD's score is 4, Age, Hypertension, CHF, and diabetes (diet controlled). Creatinine Cl 41. 1, GFR 65.  Echo demonstrates EF of 55% with moderately enlarged LA.  I have discussed the need to have anticoagulation instituted in this patient. He verbalizes understanding and is willing to begin this. I will begin him on Xarelto 15 mg one by mouth daily as his creatinine clearance is 41.1. I discussed this with Dr. Tenny Craw, who is on site today, as this is a new diagnosis for atrial fibrillation. She is reviewed the current EKG, along with the echo, and the CardioNet monitor tracings. He is in agreement with my plan. The patient will return to the office in 3 weeks for reevaluation of his tolerance to Xarelto. I have given him one-month supply of 15 mg free, he will have a followup CBC in 2 weeks. He can be seen by Dr. Beulah Gandy to be est. with him as his cardiologist in the setting of Dr. Marvel Plan retirement.

## 2013-05-12 NOTE — Assessment & Plan Note (Signed)
Patient has no complaints of angina, dyspnea, or fatigue. No cardiac testing is planned at this time.

## 2013-05-25 ENCOUNTER — Encounter: Payer: Self-pay | Admitting: *Deleted

## 2013-05-25 LAB — CBC
MCV: 83.5 fL (ref 78.0–100.0)
Platelets: 144 10*3/uL — ABNORMAL LOW (ref 150–400)
RBC: 4.84 MIL/uL (ref 4.22–5.81)
RDW: 15.1 % (ref 11.5–15.5)
WBC: 5.3 10*3/uL (ref 4.0–10.5)

## 2013-06-19 ENCOUNTER — Encounter: Payer: Self-pay | Admitting: Cardiovascular Disease

## 2013-06-19 ENCOUNTER — Ambulatory Visit (INDEPENDENT_AMBULATORY_CARE_PROVIDER_SITE_OTHER): Payer: Medicare Other | Admitting: Cardiovascular Disease

## 2013-06-19 VITALS — BP 127/84 | HR 86 | Ht 69.0 in | Wt 200.5 lb

## 2013-06-19 DIAGNOSIS — E7800 Pure hypercholesterolemia, unspecified: Secondary | ICD-10-CM

## 2013-06-19 DIAGNOSIS — I350 Nonrheumatic aortic (valve) stenosis: Secondary | ICD-10-CM

## 2013-06-19 DIAGNOSIS — Z951 Presence of aortocoronary bypass graft: Secondary | ICD-10-CM

## 2013-06-19 DIAGNOSIS — I4891 Unspecified atrial fibrillation: Secondary | ICD-10-CM

## 2013-06-19 DIAGNOSIS — I10 Essential (primary) hypertension: Secondary | ICD-10-CM

## 2013-06-19 DIAGNOSIS — I2510 Atherosclerotic heart disease of native coronary artery without angina pectoris: Secondary | ICD-10-CM

## 2013-06-19 DIAGNOSIS — I359 Nonrheumatic aortic valve disorder, unspecified: Secondary | ICD-10-CM

## 2013-06-19 MED ORDER — METOPROLOL TARTRATE 50 MG PO TABS: 50.0000 mg | ORAL_TABLET | Freq: Two times a day (BID) | ORAL | Status: DC

## 2013-06-19 NOTE — Progress Notes (Signed)
Patient ID: PRANEETH BUSSEY, male   DOB: 01/12/1930, 79 y.o.   MRN: [id number]   SUBJECTIVE: Mr. Sanon is a pleasant 79 year old patient with a PMH significant for CAD, CABG 2007, normal LV function, with history of asthma, hypercholesterolemia, and hypertension. He was recently diagnosed with atrial fibrillation, and is now on Xarelto. He wore an event monitor demonstrating atrial fibrillation with a controlled heart rate.   An echocardiogram on 04-18-2013 revealed the following:  Moderate LVH. Overall systolic function was normal. The estimated ejection fraction was in the range of 55% to 60%. Akinesis to mild dyskinesis of a small segment of the basal inferolateral myocardium. - Aortic valve: Mildly to moderately calcified annulus. Trileaflet; moderately thickened, mildly calcified leaflets. Cusp separation was mildly to moderately reduced. There was mild stenosis. Trivial regurgitation. Valve area: 1.34cm^2(VTI). Valve area: 1.39cm^2 (Vmax). - Mitral valve: Calcified annulus. - Left atrium: The atrium was moderately dilated. - Right atrium: The atrium was mildly dilated. - Atrial septum: No defect or patent foramen ovale was identified. - Pulmonary arteries: PA peak pressure: 35mm Hg (S).  He denies any bleeding problems (melena/hematochezia/hematuria), and also denies chest pain, palpitations, and lightheadedness.  He has shortness of breath from asthma, but thinks it may be slightly worse in the last month.   No Known Allergies  Current Outpatient Prescriptions  Medication Sig Dispense Refill  . albuterol (PROVENTIL HFA) 108 (90 BASE) MCG/ACT inhaler Inhale 2 puffs into the lungs every 6 (six) hours as needed.  1 Inhaler  4  . alendronate (FOSAMAX) 70 MG tablet Take 70 mg by mouth every 7 (seven) days. Take with a full glass of water on an empty stomach.      Marland Kitchen amLODipine (NORVASC) 5 MG tablet Take 1 tablet (5 mg total) by mouth daily.  90 tablet  3  . aspirin 81 MG tablet  Take 81 mg by mouth daily.        . calcium carbonate (OS-CAL) 600 MG TABS tablet Take 600 mg by mouth 2 (two) times daily with a meal.      . furosemide (LASIX) 40 MG tablet Take 40 mg by mouth daily. Take 40 mg once a day with 40 mg BID every other day, alternating.      . metoprolol (LOPRESSOR) 25 MG tablet Take 1 tablet (25 mg total) by mouth 2 (two) times daily.  180 tablet  4  . Multiple Vitamins-Minerals (MULTIVITAMIN WITH MINERALS) tablet Take 1 tablet by mouth daily.        . Omega-3 Fatty Acids (FISH OIL PO) Take by mouth daily.      Marland Kitchen omeprazole (PRILOSEC) 20 MG capsule Take 20 mg by mouth daily.        . potassium chloride SA (K-DUR,KLOR-CON) 20 MEQ tablet Take 1 tablet (20 mEq total) by mouth daily.  90 tablet  4  . pravastatin (PRAVACHOL) 40 MG tablet Take 1 tablet (40 mg total) by mouth daily.  90 tablet  4  . Rivaroxaban (XARELTO) 15 MG TABS tablet Take 15 mg by mouth 2 (two) times daily with a meal.       No current facility-administered medications for this visit.    Past Medical History  Diagnosis Date  . Coronary artery disease   . Hypertension   . Diabetes mellitus     type 2 diet controlle  . Renal insufficiency     chronic  . Asthma   . Tobacco abuse   . Hematuria 2002  turp back in 2002  . COPD (chronic obstructive pulmonary disease)   . S/P cholecystectomy     Past Surgical History  Procedure Laterality Date  . Transurethral resection of prostate    . Cholecystectomy    . Coronary artery bypass graft  2007    History   Social History  . Marital Status: Widowed    Spouse Name: N/A    Number of Children: N/A  . Years of Education: N/A   Occupational History  . retired     Hotel manager   Social History Main Topics  . Smoking status: Former Games developer  . Smokeless tobacco: Never Used  . Alcohol Use: No  . Drug Use: No  . Sexual Activity: Not on file   Other Topics Concern  . Not on file   Social History Narrative  . No narrative on file      Filed Vitals:   06/19/13 1338  BP: 127/84  Pulse: 86  Height: 5\' 9"  (1.753 m)  Weight: 200 lb 8 oz (90.946 kg)  SpO2: 96%    PHYSICAL EXAM General: NAD Neck: No JVD, no thyromegaly or thyroid nodule.  Lungs: Clear to auscultation bilaterally with normal respiratory effort. CV: Nondisplaced PMI.  Heart irregular rhythm, rate is controlled, with normal S1/split S2, no S3/S4, soft I-II/VI systolic murmur.  Trace peripheral edema.  No carotid bruit.  Normal pedal pulses.  Abdomen: Soft, nontender, no hepatosplenomegaly, no distention.  Neurologic: Alert and oriented x 3.  Psych: Normal affect. Extremities: No clubbing or cyanosis.   ECG: reviewed and available in electronic records.      ASSESSMENT AND PLAN: 1. Atrial fibrillation: I wonder if his progressive shortness of breath (only mildly so) may be due to an uncontrolled ventricular response with exertion, as his resting HR is 86 bpm. For this reason, I'm increasing his metoprolol to 50 mg bid. He is tolerating Xarelto without problems. 2. CAD/CABG: asymptomatic and stable. No changes in therapy. Continue ASA and pravastatin. 3. HTN: controlled. 4. Aortic stenosis: mild, monitor clinically and echocardiographically as needed. 5. Hyperlipidemia: on pravastatin. Will check to see when most recent lipids were completed at PCP's office.   Prentice Docker, M.D., F.A.C.C.

## 2013-06-19 NOTE — Patient Instructions (Addendum)
Your physician recommends that you schedule a follow-up appointment in: 6 MONTHS  Your physician has recommended you make the following change in your medication:   1) INCREASE YOUR METOPROLOL TO 50MG  TWICE DAILY

## 2013-07-04 ENCOUNTER — Telehealth: Payer: Self-pay | Admitting: *Deleted

## 2013-07-04 MED ORDER — RIVAROXABAN 15 MG PO TABS: 15.0000 mg | ORAL_TABLET | Freq: Two times a day (BID) | ORAL | Status: DC

## 2013-07-04 NOTE — Telephone Encounter (Signed)
rx sent to pharmacy by e-script Pt aware 

## 2013-07-04 NOTE — Telephone Encounter (Signed)
Pt is out of Xarelto samples and needs rx called in if he is to stay on it. [facility] in [place].

## 2013-08-10 ENCOUNTER — Other Ambulatory Visit: Payer: Self-pay

## 2014-01-03 ENCOUNTER — Other Ambulatory Visit: Payer: Self-pay | Admitting: Cardiology

## 2014-01-30 ENCOUNTER — Telehealth: Payer: Self-pay | Admitting: Cardiovascular Disease

## 2014-01-30 MED ORDER — METOPROLOL TARTRATE 50 MG PO TABS: 50.0000 mg | ORAL_TABLET | Freq: Two times a day (BID) | ORAL | Status: DC

## 2014-01-30 NOTE — Telephone Encounter (Signed)
Received fax refill request  Rx # [id number] Medication:  Metoprolol Tart 50 mg tab Qty 180 Sig:  Take one tablet by mouth twice daily Physician:  Purvis SheffieldKoneswaran

## 2014-01-30 NOTE — Telephone Encounter (Signed)
Refill request complete 

## 2014-02-01 ENCOUNTER — Telehealth: Payer: Self-pay | Admitting: Cardiovascular Disease

## 2014-02-01 MED ORDER — RIVAROXABAN 15 MG PO TABS: 15.0000 mg | ORAL_TABLET | Freq: Two times a day (BID) | ORAL | Status: DC

## 2014-02-01 NOTE — Telephone Encounter (Signed)
Medication sent via escribe.  

## 2014-02-01 NOTE — Telephone Encounter (Signed)
Received fax refill request  Rx # [id number] Medication:  Xarelto 15mg  tab Qty 30 Sig:  Take one tablet by mouth twice daily with meals Physician:  Purvis SheffieldKoneswaran

## 2014-03-05 ENCOUNTER — Telehealth: Payer: Self-pay | Admitting: Cardiovascular Disease

## 2014-03-05 MED ORDER — AMLODIPINE BESYLATE 5 MG PO TABS: 5.0000 mg | ORAL_TABLET | Freq: Every day | ORAL | Status: DC

## 2014-03-05 NOTE — Telephone Encounter (Signed)
Received fax refill request  Rx # [id number] Medication:  Amlodipine Besylate 5 mg tab Qty 90 Sig:  Take one tablet by mouth once daily Physician:  Purvis Sheffield

## 2014-04-16 ENCOUNTER — Encounter: Payer: Self-pay | Admitting: Adult Health

## 2014-04-16 ENCOUNTER — Ambulatory Visit (INDEPENDENT_AMBULATORY_CARE_PROVIDER_SITE_OTHER): Payer: Medicare Other | Admitting: Adult Health

## 2014-04-16 VITALS — BP 100/70 | HR 89 | Ht 69.0 in | Wt 191.0 lb

## 2014-04-16 DIAGNOSIS — E7800 Pure hypercholesterolemia, unspecified: Secondary | ICD-10-CM

## 2014-04-16 DIAGNOSIS — N259 Disorder resulting from impaired renal tubular function, unspecified: Secondary | ICD-10-CM

## 2014-04-16 DIAGNOSIS — Z951 Presence of aortocoronary bypass graft: Secondary | ICD-10-CM

## 2014-04-16 MED ORDER — FUROSEMIDE 40 MG PO TABS: 40.0000 mg | ORAL_TABLET | Freq: Every day | ORAL | Status: DC

## 2014-04-16 NOTE — Assessment & Plan Note (Signed)
Labs followed by PCP

## 2014-04-16 NOTE — Assessment & Plan Note (Signed)
No complaints of chest pain, He will remain on metoprolol and statin. Continue healthy lifestyle and weight loss.

## 2014-04-16 NOTE — Progress Notes (Deleted)
Name: Jimmy [name]    DOB: 1930-05-09  Age: 80 y.o.  MR#: [id number]       PCP:  Toma Deiters, MD      Insurance: Payor: [employer] [place] MEDICARE / Plan: BLUE MEDICARE [facility] / Product Type: *No Product type* /   CC:    Chief Complaint  Patient presents with  . Coronary Artery Disease    CABG 2007  . Hyperlipidemia    VS Filed Vitals:   04/16/14 1355  BP: 100/70  Pulse: 89  Height: 5\' 9"  (1.753 m)  Weight: 191 lb (86.637 kg)  SpO2: 98%    Weights Current Weight  04/16/14 191 lb (86.637 kg)  06/19/13 200 lb 8 oz (90.946 kg)  05/12/13 202 lb (91.627 kg)    Blood Pressure  BP Readings from Last 3 Encounters:  04/16/14 100/70  06/19/13 127/84  05/12/13 132/84     Admit date:  (Not on file) Last encounter with RMR:  Visit date not found   Allergy Review of patient's allergies indicates no known allergies.  Current Outpatient Prescriptions  Medication Sig Dispense Refill  . albuterol (PROVENTIL HFA) 108 (90 BASE) MCG/ACT inhaler Inhale 2 puffs into the lungs every 6 (six) hours as needed.  1 Inhaler  4  . alendronate (FOSAMAX) 70 MG tablet Take 70 mg by mouth every 7 (seven) days. Take with a full glass of water on an empty stomach.      Marland Kitchen amLODipine (NORVASC) 5 MG tablet Take 1 tablet (5 mg total) by mouth daily.  90 tablet  3  . aspirin 81 MG tablet Take 81 mg by mouth daily.        . benazepril (LOTENSIN) 5 MG tablet Take 5 mg by mouth daily.      . calcium carbonate (OS-CAL) 600 MG TABS tablet Take 600 mg by mouth 2 (two) times daily with a meal.      . furosemide (LASIX) 40 MG tablet Take 40 mg by mouth daily. Take 40 mg once a day with 40 mg BID every other day, alternating.      . metFORMIN (GLUCOPHAGE) 500 MG tablet Take 500 mg by mouth 2 (two) times daily with a meal.      . metoprolol (LOPRESSOR) 50 MG tablet Take 1 tablet (50 mg total) by mouth 2 (two) times daily.  180 tablet  3  . Multiple Vitamins-Minerals (MULTIVITAMIN WITH MINERALS) tablet  Take 1 tablet by mouth daily.        . Omega-3 Fatty Acids (FISH OIL PO) Take by mouth daily.      Marland Kitchen omeprazole (PRILOSEC) 20 MG capsule Take 20 mg by mouth daily.        . potassium chloride SA (K-DUR,KLOR-CON) 20 MEQ tablet Take 20 mEq by mouth daily.      . pravastatin (PRAVACHOL) 40 MG tablet Take 1 tablet (40 mg total) by mouth daily.  90 tablet  4  . Rivaroxaban (XARELTO) 15 MG TABS tablet Take 15 mg by mouth daily.      . SODIUM & POTASSIUM BICARBONATE PO Take by mouth.       No current facility-administered medications for this visit.    Discontinued Meds:    Medications Discontinued During This Encounter  Medication Reason  . Rivaroxaban (XARELTO) 15 MG TABS tablet   . potassium chloride SA (K-DUR,KLOR-CON) 20 MEQ tablet     Patient Active Problem List   Diagnosis Date Noted  . Atrial fibrillation  05/12/2013  . Hypertension 09/14/2012  . Hypercholesterolemia 09/14/2011  . RBBB 06/17/2009  . COPD 06/17/2009  . CORONARY ARTERY BYPASS GRAFT, HX OF 06/17/2009  . DM 06/14/2009  . CAD 06/14/2009  . ASTHMA 06/14/2009  . RENAL INSUFFICIENCY 06/14/2009    LABS    Component Value Date/Time   NA 138 04/11/2013 1443   NA 141 06/01/2012 0728   NA 141 09/14/2011 1328   K 4.4 04/11/2013 1443   K 4.5 06/01/2012 0728   K 4.8 09/14/2011 1328   CL 101 04/11/2013 1443   CL 104 06/01/2012 0728   CL 102 09/14/2011 1328   CO2 29 04/11/2013 1443   CO2 28 06/01/2012 0728   CO2 29 09/14/2011 1328   GLUCOSE 140* 04/11/2013 1443   GLUCOSE 124* 06/01/2012 0728   GLUCOSE 115* 09/14/2011 1328   BUN 41* 04/11/2013 1443   BUN 39* 06/01/2012 0728   BUN 50* 09/14/2011 1328   CREATININE 1.86* 04/11/2013 1443   CREATININE 1.85* 06/01/2012 0728   CREATININE 1.73* 09/14/2011 1328   CALCIUM 9.4 04/11/2013 1443   CALCIUM 9.5 06/01/2012 0728   CALCIUM 9.2 09/14/2011 1328   CMP     Component Value Date/Time   NA 138 04/11/2013 1443   K 4.4 04/11/2013 1443   CL 101 04/11/2013 1443   CO2 29 04/11/2013 1443   GLUCOSE  140* 04/11/2013 1443   BUN 41* 04/11/2013 1443   CREATININE 1.86* 04/11/2013 1443   CALCIUM 9.4 04/11/2013 1443   PROT 6.9 06/01/2012 0728   ALBUMIN 4.5 06/01/2012 0728   AST 23 06/01/2012 0728   ALT 20 06/01/2012 0728   ALKPHOS 61 06/01/2012 0728   BILITOT 0.4 06/01/2012 0728       Component Value Date/Time   WBC 5.3 05/25/2013 0853   WBC 5.9 04/11/2013 1443   HGB 13.7 05/25/2013 0853   HGB 13.8 04/11/2013 1443   HCT 40.4 05/25/2013 0853   HCT 40.1 04/11/2013 1443   MCV 83.5 05/25/2013 0853   MCV 80.8 04/11/2013 1443    Lipid Panel     Component Value Date/Time   CHOL 146 06/01/2012 0728   TRIG 127 06/01/2012 0728   HDL 43 06/01/2012 0728   CHOLHDL 3.4 06/01/2012 0728   VLDL 25 06/01/2012 0728   LDLCALC 78 06/01/2012 0728    ABG No results found for this basename: phart, pco2, pco2art, po2, po2art, hco3, tco2, acidbasedef, o2sat     No results found for this basename: TSH   BNP (last 3 results) No results found for this basename: PROBNP,  in the last 8760 hours Cardiac Panel (last 3 results) No results found for this basename: CKTOTAL, CKMB, TROPONINI, RELINDX,  in the last 72 hours  Iron/TIBC/Ferritin/ %Sat No results found for this basename: iron, tibc, ferritin, ironpctsat     EKG Orders placed in visit on 05/12/13  . EKG 12-LEAD     Prior Assessment and Plan Problem List as of 04/16/2014     Cardiovascular and Mediastinum   CAD   Last Assessment & Plan   04/11/2013 Office Visit Written 04/11/2013  2:35 PM by Jodelle GrossKathryn M Lawrence, NP     He is without complaints of chest pain or DOE. He is complaining of knee pain and is unable to exercise. He will have labs and echocardiogram as he has not had cardiac testing since 2007.  Consider stress test if echo is abnormal.    RBBB   Last Assessment & Plan   04/11/2013 Office  Visit Written 04/11/2013  2:37 PM by Jodelle Gross, NP     Chronic for him, with evidence of atrial fibrillation on EKG vs sinus arrythmia. I have asked Dr. Baldemar Friday over  read the EKG on site. We will have cardionet placed on the patient for one week to ascertain cardiac rhythm over several days. He will not be placed on anticoagulation at this time. Echo and BMET will be completed with follow up with Dr. Baldemar Friday in 2-3 weeks.    CORONARY ARTERY BYPASS GRAFT, HX OF   Last Assessment & Plan   05/12/2013 Office Visit Written 05/12/2013  1:53 PM by Jodelle Gross, NP     Patient has no complaints of angina, dyspnea, or fatigue. No cardiac testing is planned at this time.    Hypertension   Last Assessment & Plan   04/11/2013 Office Visit Written 04/11/2013  2:37 PM by Jodelle Gross, NP     Excellent control of BP at this time. No changes in medications.    Atrial fibrillation   Last Assessment & Plan   05/12/2013 Office Visit Edited 05/12/2013  1:51 PM by Jodelle Gross, NP     I have discussed this new diagnosis with the patient. His CHAD's score is 4, Age, Hypertension, CHF, and diabetes (diet controlled). Creatinine Cl 41. 1, GFR 65.  Echo demonstrates EF of 55% with moderately enlarged LA.  I have discussed the need to have anticoagulation instituted in this patient. He verbalizes understanding and is willing to begin this. I will begin him on Xarelto 15 mg one by mouth daily as his creatinine clearance is 41.1. I discussed this with Dr. Tenny Craw, who is on site today, as this is a new diagnosis for atrial fibrillation. She is reviewed the current EKG, along with the echo, and the CardioNet monitor tracings. He is in agreement with my plan. The patient will return to the office in 3 weeks for reevaluation of his tolerance to Xarelto. I have given him one-month supply of 15 mg free, he will have a followup CBC in 2 weeks. He can be seen by Dr. Beulah Gandy to be est. with him as his cardiologist in the setting of Dr. Marvel Plan retirement.      Respiratory   ASTHMA   COPD     Endocrine   DM     Genitourinary   RENAL INSUFFICIENCY   Last Assessment & Plan    05/12/2013 Office Visit Written 05/12/2013  1:52 PM by Jodelle Gross, NP     Does not Xarelto is adjusted to 15 mg daily in this setting. Creatinine was 1.86.       Other   Hypercholesterolemia   Last Assessment & Plan   09/14/2012 Office Visit Written 09/14/2012 11:52 AM by Jodelle Gross, NP     We were following his cholesterol status, her primary care physician is being seen more often. He is due to have labs in 3 months. He is due for lipids and LFTs at that time. I have asked him to go ahead and have them completed during the usual blood draw in 3 months with PCP with records to be sent to [place] after results are available.        Imaging: No results found.

## 2014-04-16 NOTE — Assessment & Plan Note (Signed)
Labs followed by his PCP.

## 2014-04-16 NOTE — Progress Notes (Signed)
HPI: [name] is an 80 year old patient of Dr. Beulah Gandy we are following for ongoing assessment and management of CAD status post coronary artery bypass grafting 2007, normal LV function, hypercholesterolemia and hypertension. The patient was recently diagnosed with atrial fibrillation in March of 2014 is now on Xarelto. He is here for ongoing followup of cardiac status.  He comes today without complaints. He has lost 10 lbs and is feeling great. He is trying to lose weight and plans to go down to 170 lbs  His BP is low today. He denies bleeding or dizziness. No worsening of his shortness of breath.      No Known Allergies  Current Outpatient Prescriptions  Medication Sig Dispense Refill  . albuterol (PROVENTIL HFA) 108 (90 BASE) MCG/ACT inhaler Inhale 2 puffs into the lungs every 6 (six) hours as needed.  1 Inhaler  4  . alendronate (FOSAMAX) 70 MG tablet Take 70 mg by mouth every 7 (seven) days. Take with a full glass of water on an empty stomach.      Marland Kitchen amLODipine (NORVASC) 5 MG tablet Take 1 tablet (5 mg total) by mouth daily.  90 tablet  3  . aspirin 81 MG tablet Take 81 mg by mouth daily.        . benazepril (LOTENSIN) 5 MG tablet Take 5 mg by mouth daily.      . calcium carbonate (OS-CAL) 600 MG TABS tablet Take 600 mg by mouth 2 (two) times daily with a meal.      . furosemide (LASIX) 40 MG tablet Take 40 mg by mouth daily. Take 40 mg once a day with 40 mg BID every other day, alternating.      . metFORMIN (GLUCOPHAGE) 500 MG tablet Take 500 mg by mouth 2 (two) times daily with a meal.      . metoprolol (LOPRESSOR) 50 MG tablet Take 1 tablet (50 mg total) by mouth 2 (two) times daily.  180 tablet  3  . Multiple Vitamins-Minerals (MULTIVITAMIN WITH MINERALS) tablet Take 1 tablet by mouth daily.        . Omega-3 Fatty Acids (FISH OIL PO) Take by mouth daily.      Marland Kitchen omeprazole (PRILOSEC) 20 MG capsule Take 20 mg by mouth daily.        . potassium chloride SA (K-DUR,KLOR-CON) 20  MEQ tablet Take 20 mEq by mouth daily.      . pravastatin (PRAVACHOL) 40 MG tablet Take 1 tablet (40 mg total) by mouth daily.  90 tablet  4  . Rivaroxaban (XARELTO) 15 MG TABS tablet Take 15 mg by mouth daily.      . SODIUM & POTASSIUM BICARBONATE PO Take by mouth.       No current facility-administered medications for this visit.    Past Medical History  Diagnosis Date  . Coronary artery disease   . Hypertension   . Diabetes mellitus     type 2 diet controlle  . Renal insufficiency     chronic  . Asthma   . Tobacco abuse   . Hematuria 2002    turp back in 2002  . COPD (chronic obstructive pulmonary disease)   . S/P cholecystectomy     Past Surgical History  Procedure Laterality Date  . Transurethral resection of prostate    . Cholecystectomy    . Coronary artery bypass graft  2007    ROS: Review of systems complete and found to be negative unless listed above  PHYSICAL  EXAM BP 100/70  Pulse 89  Ht 5\' 9"  (1.753 m)  Wt 191 lb (86.637 kg)  BMI 28.19 kg/m2  SpO2 98% General: Well developed, well nourished, in no acute distress Head: Eyes PERRLA, No xanthomas.  Edentulous.  Normal cephalic and atraumatic  Lungs: Clear bilaterally to auscultation and percussion. Heart: HRRR S1 S2, without MRG.  Pulses are 2+ & equal.            No carotid bruit. No JVD.  No abdominal bruits. No femoral bruits. Abdomen: Bowel sounds are positive, abdomen soft and non-tender without masses one umbilical hernia's noted. Msk:  Back kyphosis, normal gait. Normal strength and tone for age. Extremities: No clubbing, cyanosis or edema.  DP +1 Neuro: Alert and oriented X 3. Psych:  Good affect, responds appropriately   ASSESSMENT AND PLAN

## 2014-04-16 NOTE — Patient Instructions (Signed)
Your physician recommends that you schedule a follow-up appointment in: 6 months with Dr Reggy EyeKoneswaran You will receive a reminder letter two months in advance reminding you to call and schedule your appointment. If you don't receive this letter, please contact our office.  Your physician has recommended you make the following change in your medication:   Take Lasix 40 mg once a day

## 2014-04-16 NOTE — Assessment & Plan Note (Signed)
Heart rate well controlled, no bleeding issues on Xarelto.  Refills authorized.

## 2014-04-16 NOTE — Assessment & Plan Note (Signed)
I have retaken his BP in the exam room. 120/;68. I will decrease his lasix to 40 mg daily instead of 40 mg BID QOD alternating with 40 mg daily. He is given a BP machine to use and record his BP each day. With weight loss, if his BP decreases down below 110 systolic and he is dizzy I will decrease or eliminate the amlodipine. He will return in 6 months.

## 2014-06-05 ENCOUNTER — Telehealth: Payer: Self-pay | Admitting: Adult Health

## 2014-06-05 NOTE — Telephone Encounter (Signed)
Will forward to Dr.Koneswaran for advice  Pt was paying $15/month now they want $150/month  I am going to call [facility] in [place]. I spoke with pharmacist Molly Maduro and he has no explanation  I have left message with [facility]/xarelto

## 2014-06-05 NOTE — Telephone Encounter (Signed)
Patient states that the price of his Xarelto has went up and wants to know if it can be replaced with anything. / tgs

## 2014-06-05 NOTE — Telephone Encounter (Signed)
Pt fell in "donought hole" we have provided him with free 30 supply card plus two months of samples

## 2014-11-04 ENCOUNTER — Other Ambulatory Visit: Payer: Self-pay | Admitting: Adult Health

## 2015-01-31 ENCOUNTER — Other Ambulatory Visit: Payer: Self-pay | Admitting: Cardiology

## 2015-03-04 ENCOUNTER — Other Ambulatory Visit: Payer: Self-pay | Admitting: Adult Health

## 2015-03-07 ENCOUNTER — Other Ambulatory Visit: Payer: Self-pay | Admitting: Adult Health

## 2015-04-01 ENCOUNTER — Other Ambulatory Visit: Payer: Self-pay

## 2015-04-03 ENCOUNTER — Other Ambulatory Visit: Payer: Self-pay | Admitting: Adult Health

## 2015-04-05 ENCOUNTER — Ambulatory Visit (INDEPENDENT_AMBULATORY_CARE_PROVIDER_SITE_OTHER): Payer: Medicare Other | Admitting: Adult Health

## 2015-04-05 ENCOUNTER — Encounter: Payer: Self-pay | Admitting: Adult Health

## 2015-04-05 VITALS — BP 126/70 | HR 73 | Ht 69.0 in | Wt 189.0 lb

## 2015-04-05 DIAGNOSIS — I10 Essential (primary) hypertension: Secondary | ICD-10-CM | POA: Diagnosis not present

## 2015-04-05 DIAGNOSIS — Z136 Encounter for screening for cardiovascular disorders: Secondary | ICD-10-CM | POA: Diagnosis not present

## 2015-04-05 NOTE — Progress Notes (Deleted)
Name: Jimmy [name]    DOB: Feb 29, 1930  Age: 81 y.o.  MR#: [id number]       PCP:  Toma Deiters, MD      Insurance: Payor: [employer]UE [employer] MEDICARE / Plan: BLUE MEDICARE [facility] / Product Type: *No Product type* /   CC:    Chief Complaint  Patient presents with  . Coronary Artery Disease    Hx of CABG  . Hypertension    VS Filed Vitals:   04/05/15 1331  BP: 126/70  Pulse: 73  Height:  (1.753 m)  Weight: 189 lb (85.73 kg)  SpO2: 97%    Weights Current Weight  04/05/15 189 lb (85.73 kg)  04/16/14 191 lb (86.637 kg)  06/19/13 200 lb 8 oz (90.946 kg)    Blood Pressure  BP Readings from Last 3 Encounters:  04/05/15 126/70  04/16/14 100/70  06/19/13 127/84     Admit date:  (Not on file) Last encounter with RMR:  04/03/2015   Allergy Review of patient's allergies indicates no known allergies.  Current Outpatient Prescriptions  Medication Sig Dispense Refill  . albuterol (PROVENTIL HFA) 108 (90 BASE) MCG/ACT inhaler Inhale 2 puffs into the lungs every 6 (six) hours as needed. 1 Inhaler 4  . alendronate (FOSAMAX) 70 MG tablet Take 70 mg by mouth every 7 (seven) days. Take with a full glass of water on an empty stomach.    Marland Kitchen amLODipine (NORVASC) 5 MG tablet Take 1 tablet (5 mg total) by mouth daily. 90 tablet 3  . aspirin 81 MG tablet Take 81 mg by mouth daily.      . benazepril (LOTENSIN) 5 MG tablet Take 5 mg by mouth daily.    . calcium carbonate (OS-CAL) 600 MG TABS tablet Take 600 mg by mouth 2 (two) times daily with a meal.    . furosemide (LASIX) 40 MG tablet TAKE ONE TABLET BY MOUTH ONCE DAILY 30 tablet 6  . metFORMIN (GLUCOPHAGE) 500 MG tablet Take 1,000 mg by mouth 2 (two) times daily with a meal.     . metoprolol (LOPRESSOR) 50 MG tablet TAKE ONE TABLET BY MOUTH TWICE DAILY *NEED APPOINTMENT FOR FURTHER REFILLS* 60 tablet 3  . Multiple Vitamins-Minerals (MULTIVITAMIN WITH MINERALS) tablet Take 1 tablet by mouth daily.      . Omega-3 Fatty Acids (FISH OIL  PO) Take by mouth daily.    Marland Kitchen omeprazole (PRILOSEC) 20 MG capsule Take 20 mg by mouth daily.      . pravastatin (PRAVACHOL) 40 MG tablet Take 1 tablet (40 mg total) by mouth daily. 90 tablet 4  . SODIUM & POTASSIUM BICARBONATE PO Take by mouth.    Carlena Hurl 15 MG TABS tablet TAKE ONE TABLET BY MOUTH ONCE DAILY 30 tablet 0  . potassium chloride SA (K-DUR,KLOR-CON) 20 MEQ tablet Take 20 mEq by mouth daily.     No current facility-administered medications for this visit.    Discontinued Meds:   There are no discontinued medications.  Patient Active Problem List   Diagnosis Date Noted  . Atrial fibrillation 05/12/2013  . Hypertension 09/14/2012  . Hypercholesterolemia 09/14/2011  . RBBB 06/17/2009  . COPD 06/17/2009  . CORONARY ARTERY BYPASS GRAFT, HX OF 06/17/2009  . DM 06/14/2009  . ASTHMA 06/14/2009  . RENAL INSUFFICIENCY 06/14/2009    LABS    Component Value Date/Time   NA 138 04/11/2013 1443   NA 141 06/01/2012 0728   NA 141 09/14/2011 1328   K 4.4  04/11/2013 1443   K 4.5 06/01/2012 0728   K 4.8 09/14/2011 1328   CL 101 04/11/2013 1443   CL 104 06/01/2012 0728   CL 102 09/14/2011 1328   CO2 29 04/11/2013 1443   CO2 28 06/01/2012 0728   CO2 29 09/14/2011 1328   GLUCOSE 140* 04/11/2013 1443   GLUCOSE 124* 06/01/2012 0728   GLUCOSE 115* 09/14/2011 1328   BUN 41* 04/11/2013 1443   BUN 39* 06/01/2012 0728   BUN 50* 09/14/2011 1328   CREATININE 1.86* 04/11/2013 1443   CREATININE 1.85* 06/01/2012 0728   CREATININE 1.73* 09/14/2011 1328   CALCIUM 9.4 04/11/2013 1443   CALCIUM 9.5 06/01/2012 0728   CALCIUM 9.2 09/14/2011 1328   CMP     Component Value Date/Time   NA 138 04/11/2013 1443   K 4.4 04/11/2013 1443   CL 101 04/11/2013 1443   CO2 29 04/11/2013 1443   GLUCOSE 140* 04/11/2013 1443   BUN 41* 04/11/2013 1443   CREATININE 1.86* 04/11/2013 1443   CALCIUM 9.4 04/11/2013 1443   PROT 6.9 06/01/2012 0728   ALBUMIN 4.5 06/01/2012 0728   AST 23 06/01/2012 0728    ALT 20 06/01/2012 0728   ALKPHOS 61 06/01/2012 0728   BILITOT 0.4 06/01/2012 0728       Component Value Date/Time   WBC 5.3 05/25/2013 0853   WBC 5.9 04/11/2013 1443   HGB 13.7 05/25/2013 0853   HGB 13.8 04/11/2013 1443   HCT 40.4 05/25/2013 0853   HCT 40.1 04/11/2013 1443   MCV 83.5 05/25/2013 0853   MCV 80.8 04/11/2013 1443    Lipid Panel     Component Value Date/Time   CHOL 146 06/01/2012 0728   TRIG 127 06/01/2012 0728   HDL 43 06/01/2012 0728   CHOLHDL 3.4 06/01/2012 0728   VLDL 25 06/01/2012 0728   LDLCALC 78 06/01/2012 0728    ABG No results found for: PHART, PCO2ART, PO2ART, HCO3, TCO2, ACIDBASEDEF, O2SAT   No results found for: TSH BNP (last 3 results) No results for input(s): BNP in the last 8760 hours.  ProBNP (last 3 results) No results for input(s): PROBNP in the last 8760 hours.  Cardiac Panel (last 3 results) No results for input(s): CKTOTAL, CKMB, TROPONINI, RELINDX in the last 72 hours.  Iron/TIBC/Ferritin/ %Sat No results found for: IRON, TIBC, FERRITIN, IRONPCTSAT   EKG Orders placed or performed in visit on 04/05/15  . EKG 12-Lead     Prior Assessment and Plan Problem List as of 04/05/2015      Cardiovascular and Mediastinum   RBBB   Last Assessment & Plan 04/11/2013 Office Visit Written 04/11/2013  2:37 PM by Jodelle GrossKathryn M Lawrence, NP    Chronic for him, with evidence of atrial fibrillation on EKG vs sinus arrythmia. I have asked Dr. Baldemar FridayKonaswarin over read the EKG on site. We will have cardionet placed on the patient for one week to ascertain cardiac rhythm over several days. He will not be placed on anticoagulation at this time. Echo and BMET will be completed with follow up with Dr. Baldemar FridayKonaswarin in 2-3 weeks.      CORONARY ARTERY BYPASS GRAFT, HX OF   Last Assessment & Plan 04/16/2014 Office Visit Written 04/16/2014  2:32 PM by Jodelle GrossKathryn M Lawrence, NP    No complaints of chest pain, He will remain on metoprolol and statin. Continue healthy lifestyle  and weight loss.      Hypertension   Last Assessment & Plan 04/16/2014 Office Visit Written 04/16/2014  2:27 PM by Jodelle Gross, NP    I have retaken his BP in the exam room. 120/;68. I will decrease his lasix to 40 mg daily instead of 40 mg BID QOD alternating with 40 mg daily. He is given a BP machine to use and record his BP each day. With weight loss, if his BP decreases down below 110 systolic and he is dizzy I will decrease or eliminate the amlodipine. He will return in 6 months.       Atrial fibrillation   Last Assessment & Plan 04/16/2014 Office Visit Written 04/16/2014  2:33 PM by Jodelle Gross, NP    Heart rate well controlled, no bleeding issues on Xarelto.  Refills authorized.        Respiratory   ASTHMA   COPD     Endocrine   DM     Genitourinary   RENAL INSUFFICIENCY   Last Assessment & Plan 04/16/2014 Office Visit Written 04/16/2014  2:28 PM by Jodelle Gross, NP    Labs followed by his PCP.         Other   Hypercholesterolemia   Last Assessment & Plan 04/16/2014 Office Visit Written 04/16/2014  2:32 PM by Jodelle Gross, NP    Labs followed by PCP          Imaging: No results found.

## 2015-04-05 NOTE — Patient Instructions (Signed)
Your physician wants you to follow-up in: 1 year with K. Lawrence.  You will receive a reminder letter in the mail two months in advance. If you don't receive a letter, please call our office to schedule the follow-up appointment.  Your physician recommends that you continue on your current medications as directed. Please refer to the Current Medication list given to you today.  Thank you for choosing [facility]!    

## 2015-04-05 NOTE — Progress Notes (Signed)
Cardiology Office Note   Date:  04/05/2015   ID:  Lemmie Evensdd J Dotts, DOB 1930/01/05, MRN [id number]  PCP:  Toma DeitersHASANAJ,XAJE A, MD  Cardiologist: Inis SizerKoneswaran/ Zayvier Caravello, NP   Chief Complaint  Patient presents with  . Coronary Artery Disease    Hx of CABG  . Hypertension      History of Present Illness: Jimmy [name] is a 81 y.o. male who presents for ongoing assessment and management of a CAD, status post coronary artery bypass grafting, 2007 with preserved LV function.  Hypercholesterolemia, and hypertension.  The patient had recently been diagnosed with atrial fibrillation in March of 2014 and is on anticoagulation therapy with Xarelto, CHADS VASC Score of 4.  He was last seen in the office on 04/16/2014, essentially stable from a cardiac standpoint.  He is here for annual followup.  He is without complaints. He is active, walking 10,000 steps a day, with a FIT BIT device to motivate him. He denies chest pain, dyspnea, or dizziness. He is trying to lose wt with a goal of 165 lbs. He brings with him a copy of his BP recording for the last year. He has been well controlled.    Past Medical History  Diagnosis Date  . Coronary artery disease   . Hypertension   . Diabetes mellitus     type 2 diet controlle  . Renal insufficiency     chronic  . Asthma   . Tobacco abuse   . Hematuria 2002    turp back in 2002  . COPD (chronic obstructive pulmonary disease)   . S/P cholecystectomy     Past Surgical History  Procedure Laterality Date  . Transurethral resection of prostate    . Cholecystectomy    . Coronary artery bypass graft  2007     Current Outpatient Prescriptions  Medication Sig Dispense Refill  . albuterol (PROVENTIL HFA) 108 (90 BASE) MCG/ACT inhaler Inhale 2 puffs into the lungs every 6 (six) hours as needed. 1 Inhaler 4  . alendronate (FOSAMAX) 70 MG tablet Take 70 mg by mouth every 7 (seven) days. Take with a full glass of water on an empty stomach.    Marland Kitchen. amLODipine  (NORVASC) 5 MG tablet Take 1 tablet (5 mg total) by mouth daily. 90 tablet 3  . aspirin 81 MG tablet Take 81 mg by mouth daily.      . benazepril (LOTENSIN) 5 MG tablet Take 5 mg by mouth daily.    . calcium carbonate (OS-CAL) 600 MG TABS tablet Take 600 mg by mouth 2 (two) times daily with a meal.    . furosemide (LASIX) 40 MG tablet TAKE ONE TABLET BY MOUTH ONCE DAILY 30 tablet 6  . metFORMIN (GLUCOPHAGE) 500 MG tablet Take 1,000 mg by mouth 2 (two) times daily with a meal.     . metoprolol (LOPRESSOR) 50 MG tablet TAKE ONE TABLET BY MOUTH TWICE DAILY *NEED APPOINTMENT FOR FURTHER REFILLS* 60 tablet 3  . Multiple Vitamins-Minerals (MULTIVITAMIN WITH MINERALS) tablet Take 1 tablet by mouth daily.      . Omega-3 Fatty Acids (FISH OIL PO) Take by mouth daily.    Marland Kitchen. omeprazole (PRILOSEC) 20 MG capsule Take 20 mg by mouth daily.      . pravastatin (PRAVACHOL) 40 MG tablet Take 1 tablet (40 mg total) by mouth daily. 90 tablet 4  . SODIUM & POTASSIUM BICARBONATE PO Take by mouth.    Carlena Hurl. XARELTO 15 MG TABS tablet TAKE ONE TABLET BY MOUTH  ONCE DAILY 30 tablet 0  . potassium chloride SA (K-DUR,KLOR-CON) 20 MEQ tablet Take 20 mEq by mouth daily.     No current facility-administered medications for this visit.    Allergies:   Review of patient's allergies indicates no known allergies.    Social History:  The patient  reports that he has quit smoking. He has never used smokeless tobacco. He reports that he does not drink alcohol or use illicit drugs.   Family History:  The patient's family history is not on file.    ROS: .   All other systems are reviewed and negative.Unless otherwise mentioned in  H&P above.   PHYSICAL EXAM: VS:  BP 126/70 mmHg  Pulse 73  Ht  (1.753 m)  Wt 189 lb (85.73 kg)  BMI 27.90 kg/m2  SpO2 97% , BMI Body mass index is 27.9 kg/(m^2). GEN: Well nourished, well developed, in no acute distress HEENT: normal Neck: no JVD, carotid bruits, or masses Cardiac: IRRR; no  murmurs, rubs, or gallops,no edema  Respiratory:  clear to auscultation bilaterally, normal work of breathing GI: soft, nontender, nondistended, + BS MS: no deformity or atrophy Skin: warm and dry, no rash Neuro:  Strength and sensation are intact Psych: euthymic mood, full affect   EKG:  The ekg ordered today demonstrates atrial fib with T-wave depression infero/lateral leads. Rate 84.    Recent Labs: No results found for requested labs within last 365 days.    Lipid Panel    Component Value Date/Time   CHOL 146 06/01/2012 0728   TRIG 127 06/01/2012 0728   HDL 43 06/01/2012 0728   CHOLHDL 3.4 06/01/2012 0728   VLDL 25 06/01/2012 0728   LDLCALC 78 06/01/2012 0728      Wt Readings from Last 3 Encounters:  04/05/15 189 lb (85.73 kg)  04/16/14 191 lb (86.637 kg)  06/19/13 200 lb 8 oz (90.946 kg)      Other studies Reviewed: Additional studies/ records that were reviewed today include: None. Review of the above records demonstrates: N/A   ASSESSMENT AND PLAN:  1.  CAD: Hx CABG. No complaints of recurrent chest pain or cardiac symptoms. He is active and is medically compliant. Labs are completed by PCP. Will continue current medication regimen. See him in one year.  2. Hypertension: BP is very well controlled. I have warned him that if he loses weight we will need to adjust his medications to avoid hypotension., He verbalizes understanding.   Current medicines are reviewed at length with the patient today.    Labs/ tests ordered today include: None  Orders Placed This Encounter  Procedures  . EKG 12-Lead     Disposition:   FU with 1 week.   Signed, Joni Reining, NP  04/05/2015 1:50 PM    [facility] [address] [place], [place] [address] Phone: [phone]; Fax: [phone]

## 2015-05-02 ENCOUNTER — Other Ambulatory Visit: Payer: Self-pay | Admitting: Cardiovascular Disease

## 2015-05-02 ENCOUNTER — Other Ambulatory Visit: Payer: Self-pay | Admitting: Adult Health

## 2015-06-03 ENCOUNTER — Other Ambulatory Visit: Payer: Self-pay | Admitting: Adult Health

## 2015-08-03 ENCOUNTER — Other Ambulatory Visit: Payer: Self-pay | Admitting: Adult Health

## 2015-09-23 ENCOUNTER — Other Ambulatory Visit: Payer: Self-pay | Admitting: Adult Health

## 2015-10-06 ENCOUNTER — Other Ambulatory Visit: Payer: Self-pay | Admitting: Adult Health

## 2015-12-04 ENCOUNTER — Other Ambulatory Visit: Payer: Self-pay | Admitting: *Deleted

## 2015-12-04 MED ORDER — FUROSEMIDE 40 MG PO TABS: 40.0000 mg | ORAL_TABLET | Freq: Every day | ORAL | Status: AC

## 2016-01-16 DIAGNOSIS — Z6827 Body mass index (BMI) 27.0-27.9, adult: Secondary | ICD-10-CM | POA: Diagnosis not present

## 2016-01-16 DIAGNOSIS — E1121 Type 2 diabetes mellitus with diabetic nephropathy: Secondary | ICD-10-CM | POA: Diagnosis not present

## 2016-01-16 DIAGNOSIS — I10 Essential (primary) hypertension: Secondary | ICD-10-CM | POA: Diagnosis not present

## 2016-01-30 ENCOUNTER — Other Ambulatory Visit: Payer: Self-pay | Admitting: Adult Health

## 2016-04-02 ENCOUNTER — Other Ambulatory Visit: Payer: Self-pay | Admitting: Cardiovascular Disease

## 2016-04-20 ENCOUNTER — Encounter: Payer: Medicare Other | Admitting: Adult Health

## 2016-04-20 NOTE — Progress Notes (Signed)
Cardiology Office Note   Date:  04/20/2016   ID:  Jimmy [name], DOB [birth year], MRN [id number]  PCP:  Toma DeitersXAJE A HASANAJ, MD  Cardiologist: Inis SizerKoneswaran/  Charlea Nardo, NP   ERROR Rescheduled

## 2016-04-23 DIAGNOSIS — I10 Essential (primary) hypertension: Secondary | ICD-10-CM | POA: Diagnosis not present

## 2016-04-23 DIAGNOSIS — Z6825 Body mass index (BMI) 25.0-25.9, adult: Secondary | ICD-10-CM | POA: Diagnosis not present

## 2016-04-23 DIAGNOSIS — E1121 Type 2 diabetes mellitus with diabetic nephropathy: Secondary | ICD-10-CM | POA: Diagnosis not present

## 2016-05-04 ENCOUNTER — Other Ambulatory Visit: Payer: Self-pay | Admitting: Cardiovascular Disease

## 2016-05-08 ENCOUNTER — Ambulatory Visit (INDEPENDENT_AMBULATORY_CARE_PROVIDER_SITE_OTHER): Payer: Medicare Other | Admitting: Cardiovascular Disease

## 2016-05-08 ENCOUNTER — Encounter: Payer: Self-pay | Admitting: Cardiovascular Disease

## 2016-05-08 VITALS — BP 110/68 | HR 108 | Ht 69.0 in | Wt 179.0 lb

## 2016-05-08 DIAGNOSIS — I10 Essential (primary) hypertension: Secondary | ICD-10-CM

## 2016-05-08 DIAGNOSIS — I4891 Unspecified atrial fibrillation: Secondary | ICD-10-CM | POA: Diagnosis not present

## 2016-05-08 DIAGNOSIS — E785 Hyperlipidemia, unspecified: Secondary | ICD-10-CM | POA: Diagnosis not present

## 2016-05-08 DIAGNOSIS — I25709 Atherosclerosis of coronary artery bypass graft(s), unspecified, with unspecified angina pectoris: Secondary | ICD-10-CM

## 2016-05-08 NOTE — Patient Instructions (Signed)

## 2016-05-08 NOTE — Progress Notes (Signed)
SUBJECTIVE: The patient is an 82 year old male with a history of coronary artery disease and CABG in 2007, atrial fibrillation, aortic stenosis, hypertension, and hyperlipidemia. He has been following with Harriet Pho NP. I last saw him in September 2014.  Echocardiogram 04/18/13 normal left ventricular systolic function, LVEF 55-60%, wall motion abnormalities, mild aortic stenosis.  ECG performed in the office today which I personally interpreted demonstrated atrial fibrillation, heart rate 83 bpm, right bundle branch block, and diffuse T-wave abnormalities.  He is doing well and denies chest pain, leg swelling, bleeding problems, and shortness of breath. He said he has not been hospitalized since 2007 when he had CABG.  Review of Systems: As per "subjective", otherwise negative.  No Known Allergies  Current Outpatient Prescriptions  Medication Sig Dispense Refill  . albuterol (PROVENTIL HFA) 108 (90 BASE) MCG/ACT inhaler Inhale 2 puffs into the lungs every 6 (six) hours as needed. 1 Inhaler 4  . alendronate (FOSAMAX) 70 MG tablet Take 70 mg by mouth every 7 (seven) days. Take with a full glass of water on an empty stomach.    Marland Kitchen amLODipine (NORVASC) 5 MG tablet TAKE ONE TABLET BY MOUTH ONCE DAILY 15 tablet 0  . aspirin 81 MG tablet Take 81 mg by mouth daily.      . benazepril (LOTENSIN) 5 MG tablet Take 5 mg by mouth daily.    . calcium carbonate (OS-CAL) 600 MG TABS tablet Take 600 mg by mouth 2 (two) times daily with a meal.    . furosemide (LASIX) 40 MG tablet Take 1 tablet (40 mg total) by mouth daily. 30 tablet 6  . metFORMIN (GLUCOPHAGE) 500 MG tablet Take 1,000 mg by mouth 2 (two) times daily with a meal.     . metoprolol (LOPRESSOR) 50 MG tablet TAKE ONE TABLET BY MOUTH TWICE DAILY 14 tablet 0  . Multiple Vitamins-Minerals (MULTIVITAMIN WITH MINERALS) tablet Take 1 tablet by mouth daily.      . Omega-3 Fatty Acids (FISH OIL PO) Take by mouth daily.    Marland Kitchen omeprazole  (PRILOSEC) 20 MG capsule Take 20 mg by mouth daily.      . pravastatin (PRAVACHOL) 40 MG tablet Take 1 tablet (40 mg total) by mouth daily. 90 tablet 4  . SODIUM & POTASSIUM BICARBONATE PO Take by mouth.    Carlena Hurl 15 MG TABS tablet TAKE ONE TABLET BY MOUTH ONCE DAILY 30 tablet 6  . potassium chloride SA (K-DUR,KLOR-CON) 20 MEQ tablet Take 20 mEq by mouth daily.     No current facility-administered medications for this visit.     Past Medical History:  Diagnosis Date  . Asthma   . COPD (chronic obstructive pulmonary disease) (HCC)   . Coronary artery disease   . Diabetes mellitus    type 2 diet controlle  . Hematuria 2002   turp back in 2002  . Hypertension   . Renal insufficiency    chronic  . S/P cholecystectomy   . Tobacco abuse     Past Surgical History:  Procedure Laterality Date  . CHOLECYSTECTOMY    . CORONARY ARTERY BYPASS GRAFT  2007  . TRANSURETHRAL RESECTION OF PROSTATE      Social History   Social History  . Marital status: Widowed    Spouse name: N/A  . Number of children: N/A  . Years of education: N/A   Occupational History  . retired     Hotel manager   Social History Main Topics  .  Smoking status: Former Games developer  . Smokeless tobacco: Never Used  . Alcohol use No  . Drug use: No  . Sexual activity: No   Other Topics Concern  . Not on file   Social History Narrative  . No narrative on file     Vitals:   05/08/16 0854  BP: 110/68  Pulse: (!) 108  SpO2: 98%  Weight: 179 lb (81.2 kg)  Height: 5\' 9"  (1.753 m)    PHYSICAL EXAM General: NAD HEENT: Normal. Neck: No JVD, no thyromegaly. Lungs: Clear to auscultation bilaterally with normal respiratory effort. CV: Nondisplaced PMI.  Regular rate and irregular rhythm, normal S1/S2, no S3, no murmur. No pretibial or periankle edema.  No carotid bruit.   Abdomen: Soft, nontender, no distention.  Neurologic: Alert and oriented.  Psych: Normal affect. Skin: Normal. Musculoskeletal: No gross  deformities.    ECG: Most recent ECG reviewed.      ASSESSMENT AND PLAN: 1. CAD/CABG: Symptomatically stable. No changes to therapy.  2. Atrial fibrillation: Symptomatically stable. Anticoagulated with Xarelto. Heart rate controlled on metoprolol.  3. Essential HTN: Controlled. No changes.  4. Hyperlipidemia: Continue pravastatin 40 mg.  Dispo: Follow-up 1 year.   Prentice Docker, M.D., F.A.C.C.

## 2016-05-20 ENCOUNTER — Other Ambulatory Visit: Payer: Self-pay | Admitting: Adult Health

## 2016-07-30 DIAGNOSIS — I10 Essential (primary) hypertension: Secondary | ICD-10-CM | POA: Diagnosis not present

## 2016-07-30 DIAGNOSIS — Z6826 Body mass index (BMI) 26.0-26.9, adult: Secondary | ICD-10-CM | POA: Diagnosis not present

## 2016-07-30 DIAGNOSIS — I480 Paroxysmal atrial fibrillation: Secondary | ICD-10-CM | POA: Diagnosis not present

## 2016-07-30 DIAGNOSIS — J4530 Mild persistent asthma, uncomplicated: Secondary | ICD-10-CM | POA: Diagnosis not present

## 2016-07-30 DIAGNOSIS — E1121 Type 2 diabetes mellitus with diabetic nephropathy: Secondary | ICD-10-CM | POA: Diagnosis not present

## 2016-10-28 ENCOUNTER — Other Ambulatory Visit: Payer: Self-pay | Admitting: Adult Health

## 2016-11-19 DIAGNOSIS — E1121 Type 2 diabetes mellitus with diabetic nephropathy: Secondary | ICD-10-CM | POA: Diagnosis not present

## 2016-11-19 DIAGNOSIS — N182 Chronic kidney disease, stage 2 (mild): Secondary | ICD-10-CM | POA: Diagnosis not present

## 2016-11-19 DIAGNOSIS — I10 Essential (primary) hypertension: Secondary | ICD-10-CM | POA: Diagnosis not present

## 2016-11-19 DIAGNOSIS — J4530 Mild persistent asthma, uncomplicated: Secondary | ICD-10-CM | POA: Diagnosis not present

## 2016-11-27 DIAGNOSIS — E1121 Type 2 diabetes mellitus with diabetic nephropathy: Secondary | ICD-10-CM | POA: Diagnosis not present

## 2016-11-27 DIAGNOSIS — I10 Essential (primary) hypertension: Secondary | ICD-10-CM | POA: Diagnosis not present

## 2016-11-27 DIAGNOSIS — N182 Chronic kidney disease, stage 2 (mild): Secondary | ICD-10-CM | POA: Diagnosis not present

## 2017-02-16 DIAGNOSIS — E1121 Type 2 diabetes mellitus with diabetic nephropathy: Secondary | ICD-10-CM | POA: Diagnosis not present

## 2017-02-16 DIAGNOSIS — N182 Chronic kidney disease, stage 2 (mild): Secondary | ICD-10-CM | POA: Diagnosis not present

## 2017-02-16 DIAGNOSIS — J4530 Mild persistent asthma, uncomplicated: Secondary | ICD-10-CM | POA: Diagnosis not present

## 2017-02-16 DIAGNOSIS — I10 Essential (primary) hypertension: Secondary | ICD-10-CM | POA: Diagnosis not present

## 2017-02-23 ENCOUNTER — Other Ambulatory Visit: Payer: Self-pay | Admitting: Adult Health

## 2017-04-18 ENCOUNTER — Other Ambulatory Visit: Payer: Self-pay | Admitting: Cardiovascular Disease

## 2017-04-28 ENCOUNTER — Other Ambulatory Visit: Payer: Self-pay | Admitting: Cardiovascular Disease

## 2017-05-10 ENCOUNTER — Encounter: Payer: Self-pay | Admitting: Cardiovascular Disease

## 2017-05-10 ENCOUNTER — Ambulatory Visit (INDEPENDENT_AMBULATORY_CARE_PROVIDER_SITE_OTHER): Payer: Medicare Other | Admitting: Cardiovascular Disease

## 2017-05-10 VITALS — BP 124/76 | HR 67 | Ht 69.0 in | Wt 191.0 lb

## 2017-05-10 DIAGNOSIS — I25708 Atherosclerosis of coronary artery bypass graft(s), unspecified, with other forms of angina pectoris: Secondary | ICD-10-CM

## 2017-05-10 DIAGNOSIS — I350 Nonrheumatic aortic (valve) stenosis: Secondary | ICD-10-CM | POA: Diagnosis not present

## 2017-05-10 DIAGNOSIS — I482 Chronic atrial fibrillation: Secondary | ICD-10-CM | POA: Diagnosis not present

## 2017-05-10 DIAGNOSIS — I10 Essential (primary) hypertension: Secondary | ICD-10-CM | POA: Diagnosis not present

## 2017-05-10 DIAGNOSIS — E785 Hyperlipidemia, unspecified: Secondary | ICD-10-CM

## 2017-05-10 DIAGNOSIS — I4821 Permanent atrial fibrillation: Secondary | ICD-10-CM

## 2017-05-10 NOTE — Progress Notes (Signed)
SUBJECTIVE: The patient presents for routine follow-up of coronary artery disease with history of CABG in 2007, atrial fibrillation, aortic stenosis, hypertension, and hyperlipidemia.  Echocardiogram 04/18/13 normal left ventricular systolic function, LVEF 55-60%, wall motion abnormalities, mild aortic stenosis.  He has not had any hospitalizations. He denies chest pain, lightheadedness, dizziness, leg swelling, and syncope. He has chronic exertional dyspnea which is stable.  ECG performed in the office today which I ordered an personally interpreted demonstrated rate controlled atrial fibrillation with a right bundle branch block and diffuse nonspecific ST segment abnormalities and T wave inversions.   Social history: He is originally from the [place] side of [address]. Served in the [employer].   Review of Systems: As per "subjective", otherwise negative.  No Known Allergies  Current Outpatient Prescriptions  Medication Sig Dispense Refill  . albuterol (PROVENTIL HFA) 108 (90 BASE) MCG/ACT inhaler Inhale 2 puffs into the lungs every 6 (six) hours as needed. 1 Inhaler 4  . alendronate (FOSAMAX) 70 MG tablet Take 70 mg by mouth every 7 (seven) days. Take with a full glass of water on an empty stomach.    Marland Kitchen amLODipine (NORVASC) 5 MG tablet TAKE ONE TABLET BY MOUTH ONCE DAILY, NEED APPOINTMENT FOR FURTHER REFILL 30 tablet 11  . aspirin 81 MG tablet Take 81 mg by mouth daily.      . benazepril (LOTENSIN) 5 MG tablet Take 5 mg by mouth daily.    . calcium carbonate (OS-CAL) 600 MG TABS tablet Take 600 mg by mouth 2 (two) times daily with a meal.    . furosemide (LASIX) 40 MG tablet Take 1 tablet (40 mg total) by mouth daily. 30 tablet 6  . metFORMIN (GLUCOPHAGE) 500 MG tablet Take 1,000 mg by mouth 2 (two) times daily with a meal.     . metoprolol tartrate (LOPRESSOR) 50 MG tablet TAKE 1 TABLET BY MOUTH TWICE DAILY ( PT MUST MAKE APPOINTMENT FOR REFILLS ) 60 tablet 0  . Multiple  Vitamins-Minerals (MULTIVITAMIN WITH MINERALS) tablet Take 1 tablet by mouth daily.      . Omega-3 Fatty Acids (FISH OIL PO) Take by mouth daily.    Marland Kitchen omeprazole (PRILOSEC) 20 MG capsule Take 20 mg by mouth daily.      . polyethylene glycol (MIRALAX / GLYCOLAX) packet Take 17 g by mouth daily as needed (constipation).    . potassium chloride SA (K-DUR,KLOR-CON) 20 MEQ tablet Take 20 mEq by mouth 2 (two) times daily.    . pravastatin (PRAVACHOL) 40 MG tablet Take 1 tablet (40 mg total) by mouth daily. 90 tablet 4  . ranitidine (ZANTAC) 300 MG capsule Take 300 mg by mouth 2 (two) times daily.    . SODIUM & POTASSIUM BICARBONATE PO Take by mouth.    Carlena Hurl 15 MG TABS tablet TAKE 1 TABLET BY MOUTH ONCE DAILY 30 tablet 3   No current facility-administered medications for this visit.     Past Medical History:  Diagnosis Date  . Asthma   . COPD (chronic obstructive pulmonary disease) (HCC)   . Coronary artery disease   . Diabetes mellitus    type 2 diet controlle  . Hematuria 2002   turp back in 2002  . Hypertension   . Renal insufficiency    chronic  . S/P cholecystectomy   . Tobacco abuse     Past Surgical History:  Procedure Laterality Date  . CHOLECYSTECTOMY    . CORONARY ARTERY BYPASS GRAFT  2007  .  TRANSURETHRAL RESECTION OF PROSTATE      Social History   Social History  . Marital status: Widowed    Spouse name: N/A  . Number of children: N/A  . Years of education: N/A   Occupational History  . retired     Hotel managermilitary   Social History Main Topics  . Smoking status: Former Games developermoker  . Smokeless tobacco: Never Used  . Alcohol use No  . Drug use: No  . Sexual activity: No   Other Topics Concern  . Not on file   Social History Narrative  . No narrative on file     Vitals:   05/10/17 1129  BP: 124/76  Pulse: 67  SpO2: 97%  Weight: 191 lb (86.6 kg)  Height: 5\' 9"  (1.753 m)    Wt Readings from Last 3 Encounters:  05/10/17 191 lb (86.6 kg)  05/08/16 179  lb (81.2 kg)  04/05/15 189 lb (85.7 kg)     PHYSICAL EXAM General: NAD HEENT: Normal. Neck: No JVD, no thyromegaly. Lungs: Clear to auscultation bilaterally with normal respiratory effort. CV: Nondisplaced PMI.  Regular rate and irregular rhythm, normal S1/S2, no S3, no murmur. No pretibial or periankle edema.  No carotid bruit.   Abdomen: Soft, nontender, no distention.  Neurologic: Alert and oriented.  Psych: Normal affect. Skin: Normal. Musculoskeletal: No gross deformities.    ECG: Most recent ECG reviewed.   Labs: Lab Results  Component Value Date/Time   K 4.4 04/11/2013 02:43 PM   BUN 41 (H) 04/11/2013 02:43 PM   CREATININE 1.86 (H) 04/11/2013 02:43 PM   ALT 20 06/01/2012 07:28 AM   HGB 13.7 05/25/2013 08:53 AM     Lipids: Lab Results  Component Value Date/Time   LDLCALC 78 06/01/2012 07:28 AM   CHOL 146 06/01/2012 07:28 AM   TRIG 127 06/01/2012 07:28 AM   HDL 43 06/01/2012 07:28 AM       ASSESSMENT AND PLAN:  1. CAD/CABG: Symptomatically stable. No changes to therapy. Continue aspirin, metoprolol, and pravastatin.  2. Atrial fibrillation: Symptomatically stable. Anticoagulated with Xarelto. Heart rate controlled on metoprolol.  3. Essential HTN: Controlled. No changes.  4. Hyperlipidemia: Continue pravastatin 40 mg.  5. Aortic stenosis: Mild in 2014. No murmurs on exam. I will monitor.     Disposition: Follow up 1 yr   Prentice DockerSuresh Sapir Lavey, M.D., F.A.C.C.

## 2017-05-10 NOTE — Patient Instructions (Signed)

## 2017-05-20 DIAGNOSIS — E1121 Type 2 diabetes mellitus with diabetic nephropathy: Secondary | ICD-10-CM | POA: Diagnosis not present

## 2017-05-20 DIAGNOSIS — J4530 Mild persistent asthma, uncomplicated: Secondary | ICD-10-CM | POA: Diagnosis not present

## 2017-05-20 DIAGNOSIS — I10 Essential (primary) hypertension: Secondary | ICD-10-CM | POA: Diagnosis not present

## 2017-05-20 DIAGNOSIS — N182 Chronic kidney disease, stage 2 (mild): Secondary | ICD-10-CM | POA: Diagnosis not present

## 2017-06-20 ENCOUNTER — Other Ambulatory Visit: Payer: Self-pay | Admitting: Adult Health

## 2017-07-05 DIAGNOSIS — Z23 Encounter for immunization: Secondary | ICD-10-CM | POA: Diagnosis not present

## 2017-08-20 DIAGNOSIS — I10 Essential (primary) hypertension: Secondary | ICD-10-CM | POA: Diagnosis not present

## 2017-08-20 DIAGNOSIS — I480 Paroxysmal atrial fibrillation: Secondary | ICD-10-CM | POA: Diagnosis not present

## 2017-08-20 DIAGNOSIS — E1121 Type 2 diabetes mellitus with diabetic nephropathy: Secondary | ICD-10-CM | POA: Diagnosis not present

## 2017-08-20 DIAGNOSIS — N182 Chronic kidney disease, stage 2 (mild): Secondary | ICD-10-CM | POA: Diagnosis not present

## 2017-12-02 DIAGNOSIS — H2513 Age-related nuclear cataract, bilateral: Secondary | ICD-10-CM | POA: Diagnosis not present

## 2017-12-02 DIAGNOSIS — H40033 Anatomical narrow angle, bilateral: Secondary | ICD-10-CM | POA: Diagnosis not present

## 2017-12-07 DIAGNOSIS — I10 Essential (primary) hypertension: Secondary | ICD-10-CM | POA: Diagnosis not present

## 2017-12-07 DIAGNOSIS — I480 Paroxysmal atrial fibrillation: Secondary | ICD-10-CM | POA: Diagnosis not present

## 2017-12-07 DIAGNOSIS — E1121 Type 2 diabetes mellitus with diabetic nephropathy: Secondary | ICD-10-CM | POA: Diagnosis not present

## 2017-12-07 DIAGNOSIS — J440 Chronic obstructive pulmonary disease with acute lower respiratory infection: Secondary | ICD-10-CM | POA: Diagnosis not present

## 2017-12-17 DIAGNOSIS — H25812 Combined forms of age-related cataract, left eye: Secondary | ICD-10-CM | POA: Diagnosis not present

## 2017-12-24 DIAGNOSIS — H04123 Dry eye syndrome of bilateral lacrimal glands: Secondary | ICD-10-CM | POA: Diagnosis not present

## 2017-12-31 DIAGNOSIS — H25811 Combined forms of age-related cataract, right eye: Secondary | ICD-10-CM | POA: Diagnosis not present

## 2018-01-07 DIAGNOSIS — H04123 Dry eye syndrome of bilateral lacrimal glands: Secondary | ICD-10-CM | POA: Diagnosis not present

## 2018-01-13 ENCOUNTER — Other Ambulatory Visit: Payer: Self-pay | Admitting: Adult Health

## 2018-01-21 DIAGNOSIS — H04123 Dry eye syndrome of bilateral lacrimal glands: Secondary | ICD-10-CM | POA: Diagnosis not present

## 2018-01-21 DIAGNOSIS — R69 Illness, unspecified: Secondary | ICD-10-CM | POA: Diagnosis not present

## 2018-03-10 DIAGNOSIS — I480 Paroxysmal atrial fibrillation: Secondary | ICD-10-CM | POA: Diagnosis not present

## 2018-03-10 DIAGNOSIS — J440 Chronic obstructive pulmonary disease with acute lower respiratory infection: Secondary | ICD-10-CM | POA: Diagnosis not present

## 2018-03-10 DIAGNOSIS — E1121 Type 2 diabetes mellitus with diabetic nephropathy: Secondary | ICD-10-CM | POA: Diagnosis not present

## 2018-03-10 DIAGNOSIS — I10 Essential (primary) hypertension: Secondary | ICD-10-CM | POA: Diagnosis not present

## 2018-03-11 DIAGNOSIS — Z6828 Body mass index (BMI) 28.0-28.9, adult: Secondary | ICD-10-CM | POA: Diagnosis not present

## 2018-03-11 DIAGNOSIS — J440 Chronic obstructive pulmonary disease with acute lower respiratory infection: Secondary | ICD-10-CM | POA: Diagnosis not present

## 2018-03-11 DIAGNOSIS — I10 Essential (primary) hypertension: Secondary | ICD-10-CM | POA: Diagnosis not present

## 2018-03-11 DIAGNOSIS — E1121 Type 2 diabetes mellitus with diabetic nephropathy: Secondary | ICD-10-CM | POA: Diagnosis not present

## 2018-06-15 DIAGNOSIS — J449 Chronic obstructive pulmonary disease, unspecified: Secondary | ICD-10-CM | POA: Diagnosis not present

## 2018-06-15 DIAGNOSIS — I10 Essential (primary) hypertension: Secondary | ICD-10-CM | POA: Diagnosis not present

## 2018-06-15 DIAGNOSIS — I480 Paroxysmal atrial fibrillation: Secondary | ICD-10-CM | POA: Diagnosis not present

## 2018-06-15 DIAGNOSIS — E1121 Type 2 diabetes mellitus with diabetic nephropathy: Secondary | ICD-10-CM | POA: Diagnosis not present

## 2018-07-05 DIAGNOSIS — Z23 Encounter for immunization: Secondary | ICD-10-CM | POA: Diagnosis not present

## 2018-08-10 ENCOUNTER — Other Ambulatory Visit: Payer: Self-pay | Admitting: Cardiovascular Disease

## 2018-09-12 ENCOUNTER — Other Ambulatory Visit: Payer: Self-pay | Admitting: Cardiovascular Disease

## 2018-09-21 DIAGNOSIS — J449 Chronic obstructive pulmonary disease, unspecified: Secondary | ICD-10-CM | POA: Diagnosis not present

## 2018-09-21 DIAGNOSIS — I480 Paroxysmal atrial fibrillation: Secondary | ICD-10-CM | POA: Diagnosis not present

## 2018-09-21 DIAGNOSIS — E1121 Type 2 diabetes mellitus with diabetic nephropathy: Secondary | ICD-10-CM | POA: Diagnosis not present

## 2018-09-21 DIAGNOSIS — I10 Essential (primary) hypertension: Secondary | ICD-10-CM | POA: Diagnosis not present

## 2018-09-23 DIAGNOSIS — L570 Actinic keratosis: Secondary | ICD-10-CM | POA: Diagnosis not present

## 2018-09-23 DIAGNOSIS — Z6826 Body mass index (BMI) 26.0-26.9, adult: Secondary | ICD-10-CM | POA: Diagnosis not present

## 2018-09-23 DIAGNOSIS — J449 Chronic obstructive pulmonary disease, unspecified: Secondary | ICD-10-CM | POA: Diagnosis not present

## 2018-09-23 DIAGNOSIS — E1121 Type 2 diabetes mellitus with diabetic nephropathy: Secondary | ICD-10-CM | POA: Diagnosis not present

## 2018-10-06 ENCOUNTER — Other Ambulatory Visit: Payer: Self-pay | Admitting: Cardiovascular Disease

## 2018-10-07 ENCOUNTER — Encounter: Payer: Self-pay | Admitting: Cardiovascular Disease

## 2018-10-07 ENCOUNTER — Ambulatory Visit: Payer: Medicare Other | Admitting: Cardiovascular Disease

## 2018-10-07 VITALS — BP 128/85 | HR 83 | Ht 69.0 in | Wt 185.8 lb

## 2018-10-07 DIAGNOSIS — I350 Nonrheumatic aortic (valve) stenosis: Secondary | ICD-10-CM

## 2018-10-07 DIAGNOSIS — R600 Localized edema: Secondary | ICD-10-CM

## 2018-10-07 DIAGNOSIS — I10 Essential (primary) hypertension: Secondary | ICD-10-CM | POA: Diagnosis not present

## 2018-10-07 DIAGNOSIS — I25708 Atherosclerosis of coronary artery bypass graft(s), unspecified, with other forms of angina pectoris: Secondary | ICD-10-CM | POA: Diagnosis not present

## 2018-10-07 DIAGNOSIS — I4821 Permanent atrial fibrillation: Secondary | ICD-10-CM

## 2018-10-07 DIAGNOSIS — E785 Hyperlipidemia, unspecified: Secondary | ICD-10-CM | POA: Diagnosis not present

## 2018-10-07 NOTE — Progress Notes (Signed)
SUBJECTIVE: The patient presents for routine follow-up of coronary artery disease with history of CABG in 2007, permanent atrial fibrillation, aortic stenosis, hypertension, and hyperlipidemia.  Echocardiogram 04/18/2013: Normal left ventricular systolic function, LVEF 55 to 51%, moderate LVH, wall motion abnormalities, mild aortic stenosis.  ECG performed today which I personally reviewed demonstrates atrial fibrillation with right bundle branch block and nonspecific ST segment abnormalities.  The patient denies any symptoms of chest pain, palpitations, shortness of breath, lightheadedness, dizziness, orthopnea, PND, and syncope.  He has occasional leg swelling alleviated with Lasix.   Social history: He is originally from the [place] of [address]. Served in the [employer].  Review of Systems: As per "subjective", otherwise negative.  No Known Allergies  Current Outpatient Medications  Medication Sig Dispense Refill  . albuterol (PROVENTIL HFA) 108 (90 BASE) MCG/ACT inhaler Inhale 2 puffs into the lungs every 6 (six) hours as needed. 1 Inhaler 4  . amLODipine (NORVASC) 5 MG tablet TAKE ONE TABLET BY MOUTH ONCE DAILY, NEED APPOINTMENT FOR FURTHER REFILL 30 tablet 11  . aspirin 81 MG tablet Take 81 mg by mouth daily.      . benazepril (LOTENSIN) 5 MG tablet Take 5 mg by mouth daily.    . calcium carbonate (OS-CAL) 600 MG TABS tablet Take 600 mg by mouth 2 (two) times daily with a meal.    . furosemide (LASIX) 40 MG tablet Take 1 tablet (40 mg total) by mouth daily. 30 tablet 6  . metFORMIN (GLUCOPHAGE) 500 MG tablet Take 1,000 mg by mouth 2 (two) times daily with a meal.     . metoprolol tartrate (LOPRESSOR) 50 MG tablet TAKE 1 TABLET BY MOUTH TWICE DAILY ( PT MUST MAKE APPOINTMENT FOR REFILLS ) 60 tablet 0  . Multiple Vitamins-Minerals (MULTIVITAMIN WITH MINERALS) tablet Take 1 tablet by mouth daily.      . Omega-3 Fatty Acids (FISH OIL PO) Take by mouth daily.    .  polyethylene glycol (MIRALAX / GLYCOLAX) packet Take 17 g by mouth daily as needed (constipation).    . potassium chloride SA (K-DUR,KLOR-CON) 20 MEQ tablet Take 20 mEq by mouth daily.     . pravastatin (PRAVACHOL) 40 MG tablet Take 1 tablet (40 mg total) by mouth daily. 90 tablet 4  . ranitidine (ZANTAC) 300 MG capsule Take 300 mg by mouth 2 (two) times daily.    . SODIUM & POTASSIUM BICARBONATE PO Take 10 g by mouth daily.     Carlena Hurl 15 MG TABS tablet TAKE 1 TABLET BY MOUTH EVERY DAY 10 tablet 0   No current facility-administered medications for this visit.     Past Medical History:  Diagnosis Date  . Asthma   . COPD (chronic obstructive pulmonary disease) (HCC)   . Coronary artery disease   . Diabetes mellitus    type 2 diet controlle  . Hematuria 2002   turp back in 2002  . Hypertension   . Renal insufficiency    chronic  . S/P cholecystectomy   . Tobacco abuse     Past Surgical History:  Procedure Laterality Date  . CHOLECYSTECTOMY    . CORONARY ARTERY BYPASS GRAFT  2007  . TRANSURETHRAL RESECTION OF PROSTATE      Social History   Socioeconomic History  . Marital status: Widowed    Spouse name: Not on file  . Number of children: Not on file  . Years of education: Not on file  . Highest education  level: Not on file  Occupational History  . Occupation: retired    Comment: Administrator, artsmilitary  Social Needs  . Financial resource strain: Not on file  . Food insecurity:    Worry: Not on file    Inability: Not on file  . Transportation needs:    Medical: Not on file    Non-medical: Not on file  Tobacco Use  . Smoking status: Former Games developermoker  . Smokeless tobacco: Never Used  Substance and Sexual Activity  . Alcohol use: No  . Drug use: No  . Sexual activity: Never  Lifestyle  . Physical activity:    Days per week: Not on file    Minutes per session: Not on file  . Stress: Not on file  Relationships  . Social connections:    Talks on phone: Not on file    Gets  together: Not on file    Attends religious service: Not on file    Active member of club or organization: Not on file    Attends meetings of clubs or organizations: Not on file    Relationship status: Not on file  . Intimate partner violence:    Fear of current or ex partner: Not on file    Emotionally abused: Not on file    Physically abused: Not on file    Forced sexual activity: Not on file  Other Topics Concern  . Not on file  Social History Narrative  . Not on file     Vitals:   10/07/18 1344  BP: 128/85  Pulse: 83  Weight: 185 lb 12.8 oz (84.3 kg)  Height: 5\' 9"  (1.753 m)    Wt Readings from Last 3 Encounters:  10/07/18 185 lb 12.8 oz (84.3 kg)  05/10/17 191 lb (86.6 kg)  05/08/16 179 lb (81.2 kg)     PHYSICAL EXAM General: NAD HEENT: Normal. Neck: No JVD, no thyromegaly. Lungs: Clear to auscultation bilaterally with normal respiratory effort. CV: Regular rate and irregular rhythm, normal S1/S2, no S3, no murmur.  1+ pitting bilateral lower extremity edema.  No carotid bruit.   Abdomen: Soft, nontender, no distention.  Neurologic: Alert and oriented.  Psych: Normal affect. Skin: Normal. Musculoskeletal: No gross deformities.    ECG: Reviewed above under Subjective   Labs: Lab Results  Component Value Date/Time   K 4.4 04/11/2013 02:43 PM   BUN 41 (H) 04/11/2013 02:43 PM   CREATININE 1.86 (H) 04/11/2013 02:43 PM   ALT 20 06/01/2012 07:28 AM   HGB 13.7 05/25/2013 08:53 AM     Lipids: Lab Results  Component Value Date/Time   LDLCALC 78 06/01/2012 07:28 AM   CHOL 146 06/01/2012 07:28 AM   TRIG 127 06/01/2012 07:28 AM   HDL 43 06/01/2012 07:28 AM       ASSESSMENT AND PLAN: 1. CAD: History of CABG. Symptomatically stable. Continue metoprolol and pravastatin.  I will stop aspirin as he is on Xarelto.  I will obtain a copy of his lipid panel and basic metabolic panel from his PCP.  2.  Permanent atrial fibrillation:Symptomatically stable.  Anticoagulated with renally dosed Xarelto. Heart rate controlled on metoprolol.  3. Essential HTN: Controlled. No changes.  4. Hyperlipidemia:Continue pravastatin 40 mg.  I will obtain a copy of lipids from PCP.  5. Aortic stenosis: Mild in in severity by echocardiogram in 2014. No murmurs on exam. I will obtain a follow-up echocardiogram.  6.  Bilateral leg edema: Currently on Lasix.   Disposition: Follow up 1 year  Kate Sable, M.D., F.A.C.C.

## 2018-10-07 NOTE — Patient Instructions (Signed)
Medication Instructions:   Stop Aspirin.  Continue all other medications.    Labwork: none  Testing/Procedures:  Your physician has requested that you have an echocardiogram. Echocardiography is a painless test that uses sound waves to create images of your heart. It provides your doctor with information about the size and shape of your heart and how well your heart's chambers and valves are working. This procedure takes approximately one hour. There are no restrictions for this procedure.  Office will contact with results via phone or letter.    Follow-Up: Your physician wants you to follow up in:  1 year.  You will receive a reminder letter in the mail one-two months in advance.  If you don't receive a letter, please call our office to schedule the follow up appointment   Any Other Special Instructions Will Be Listed Below (If Applicable).  If you need a refill on your cardiac medications before your next appointment, please call your pharmacy.

## 2018-10-07 NOTE — Addendum Note (Signed)
Addended by: Lesle Chris on: 10/07/2018 02:17 PM   Modules accepted: Orders

## 2018-10-14 ENCOUNTER — Encounter: Payer: Self-pay | Admitting: *Deleted

## 2018-10-20 ENCOUNTER — Other Ambulatory Visit: Payer: Self-pay | Admitting: Cardiovascular Disease

## 2018-10-27 ENCOUNTER — Telehealth: Payer: Self-pay | Admitting: *Deleted

## 2018-10-27 ENCOUNTER — Ambulatory Visit (INDEPENDENT_AMBULATORY_CARE_PROVIDER_SITE_OTHER): Payer: Medicare Other

## 2018-10-27 ENCOUNTER — Other Ambulatory Visit: Payer: Self-pay

## 2018-10-27 DIAGNOSIS — I350 Nonrheumatic aortic (valve) stenosis: Secondary | ICD-10-CM | POA: Diagnosis not present

## 2018-10-27 NOTE — Telephone Encounter (Signed)
Notes recorded by Lesle ChrisHill, Gracianna Vink G, LPN on 1/61/09601/23/2020 at 2:21 PM EST Patient notified. Copy to pmd. ------  Notes recorded by Laqueta LindenKoneswaran, Suresh A, MD on 10/27/2018 at 12:59 PM EST Normal pumping function, moderate calcium buildup on aortic valve with moderate narrowing, mild aortic valve leakage, moderate mitral and tricuspid valve leakage. No changes to medical therapy.

## 2018-12-28 DIAGNOSIS — I10 Essential (primary) hypertension: Secondary | ICD-10-CM | POA: Diagnosis not present

## 2018-12-28 DIAGNOSIS — J449 Chronic obstructive pulmonary disease, unspecified: Secondary | ICD-10-CM | POA: Diagnosis not present

## 2018-12-28 DIAGNOSIS — I480 Paroxysmal atrial fibrillation: Secondary | ICD-10-CM | POA: Diagnosis not present

## 2018-12-28 DIAGNOSIS — E1121 Type 2 diabetes mellitus with diabetic nephropathy: Secondary | ICD-10-CM | POA: Diagnosis not present

## 2019-03-17 DIAGNOSIS — H04123 Dry eye syndrome of bilateral lacrimal glands: Secondary | ICD-10-CM | POA: Diagnosis not present

## 2019-03-17 DIAGNOSIS — H40033 Anatomical narrow angle, bilateral: Secondary | ICD-10-CM | POA: Diagnosis not present

## 2019-04-05 DIAGNOSIS — I480 Paroxysmal atrial fibrillation: Secondary | ICD-10-CM | POA: Diagnosis not present

## 2019-04-05 DIAGNOSIS — E1121 Type 2 diabetes mellitus with diabetic nephropathy: Secondary | ICD-10-CM | POA: Diagnosis not present

## 2019-04-05 DIAGNOSIS — I10 Essential (primary) hypertension: Secondary | ICD-10-CM | POA: Diagnosis not present

## 2019-04-05 DIAGNOSIS — J449 Chronic obstructive pulmonary disease, unspecified: Secondary | ICD-10-CM | POA: Diagnosis not present

## 2019-04-11 DIAGNOSIS — I10 Essential (primary) hypertension: Secondary | ICD-10-CM | POA: Diagnosis not present

## 2019-04-11 DIAGNOSIS — N182 Chronic kidney disease, stage 2 (mild): Secondary | ICD-10-CM | POA: Diagnosis not present

## 2019-04-11 DIAGNOSIS — E1121 Type 2 diabetes mellitus with diabetic nephropathy: Secondary | ICD-10-CM | POA: Diagnosis not present

## 2019-04-11 DIAGNOSIS — Z0000 Encounter for general adult medical examination without abnormal findings: Secondary | ICD-10-CM | POA: Diagnosis not present

## 2019-04-21 ENCOUNTER — Other Ambulatory Visit: Payer: Self-pay | Admitting: *Deleted

## 2019-04-21 MED ORDER — RIVAROXABAN 15 MG PO TABS: 15.0000 mg | ORAL_TABLET | Freq: Every day | ORAL | 3 refills | Status: AC

## 2019-06-06 DEATH — deceased
